# Patient Record
Sex: Female | Born: 1965 | Race: White | Hispanic: No | Smoking: Never smoker
Health system: Southern US, Community
[De-identification: ages and names within clinical notes are randomized; demographics above are authoritative.]

## PROBLEM LIST (undated history)

## (undated) DIAGNOSIS — T8859XA Other complications of anesthesia, initial encounter: Secondary | ICD-10-CM

## (undated) DIAGNOSIS — Z9989 Dependence on other enabling machines and devices: Secondary | ICD-10-CM

## (undated) DIAGNOSIS — Z8619 Personal history of other infectious and parasitic diseases: Secondary | ICD-10-CM

## (undated) DIAGNOSIS — Z9889 Other specified postprocedural states: Secondary | ICD-10-CM

## (undated) DIAGNOSIS — M545 Low back pain, unspecified: Secondary | ICD-10-CM

## (undated) DIAGNOSIS — R519 Headache, unspecified: Secondary | ICD-10-CM

## (undated) DIAGNOSIS — R51 Headache: Secondary | ICD-10-CM

## (undated) DIAGNOSIS — Z9884 Bariatric surgery status: Secondary | ICD-10-CM

## (undated) DIAGNOSIS — D259 Leiomyoma of uterus, unspecified: Secondary | ICD-10-CM

## (undated) DIAGNOSIS — R112 Nausea with vomiting, unspecified: Secondary | ICD-10-CM

## (undated) DIAGNOSIS — G8929 Other chronic pain: Secondary | ICD-10-CM

## (undated) DIAGNOSIS — T4145XA Adverse effect of unspecified anesthetic, initial encounter: Secondary | ICD-10-CM

## (undated) DIAGNOSIS — I10 Essential (primary) hypertension: Secondary | ICD-10-CM

## (undated) DIAGNOSIS — Z973 Presence of spectacles and contact lenses: Secondary | ICD-10-CM

## (undated) DIAGNOSIS — K760 Fatty (change of) liver, not elsewhere classified: Secondary | ICD-10-CM

## (undated) DIAGNOSIS — M1990 Unspecified osteoarthritis, unspecified site: Secondary | ICD-10-CM

## (undated) DIAGNOSIS — Z87440 Personal history of urinary (tract) infections: Secondary | ICD-10-CM

## (undated) DIAGNOSIS — G4733 Obstructive sleep apnea (adult) (pediatric): Secondary | ICD-10-CM

## (undated) HISTORY — PX: OTHER SURGICAL HISTORY: SHX169

## (undated) HISTORY — PX: TONSILLECTOMY: SUR1361

## (undated) HISTORY — PX: TYMPANOSTOMY TUBE PLACEMENT: SHX32

## (undated) HISTORY — PX: ADENOIDECTOMY: SUR15

## (undated) HISTORY — PX: LAPAROSCOPIC CHOLECYSTECTOMY: SUR755

## (undated) HISTORY — PX: APPENDECTOMY: SHX54

---

## 1997-12-23 ENCOUNTER — Encounter: Admission: RE | Admit: 1997-12-23 | Discharge: 1998-03-23 | Payer: Self-pay | Admitting: Obstetrics and Gynecology

## 1999-02-10 ENCOUNTER — Other Ambulatory Visit: Admission: RE | Admit: 1999-02-10 | Discharge: 1999-02-10 | Payer: Self-pay | Admitting: Obstetrics and Gynecology

## 1999-10-18 ENCOUNTER — Emergency Department (HOSPITAL_COMMUNITY): Admission: EM | Admit: 1999-10-18 | Discharge: 1999-10-18 | Payer: Self-pay | Admitting: Emergency Medicine

## 2000-07-04 ENCOUNTER — Other Ambulatory Visit: Admission: RE | Admit: 2000-07-04 | Discharge: 2000-07-04 | Payer: Self-pay | Admitting: Obstetrics and Gynecology

## 2001-06-27 ENCOUNTER — Other Ambulatory Visit: Admission: RE | Admit: 2001-06-27 | Discharge: 2001-06-27 | Payer: Self-pay | Admitting: Obstetrics and Gynecology

## 2002-09-05 ENCOUNTER — Emergency Department (HOSPITAL_COMMUNITY): Admission: EM | Admit: 2002-09-05 | Discharge: 2002-09-05 | Payer: Self-pay | Admitting: Emergency Medicine

## 2002-10-04 ENCOUNTER — Emergency Department (HOSPITAL_COMMUNITY): Admission: EM | Admit: 2002-10-04 | Discharge: 2002-10-04 | Payer: Self-pay | Admitting: Emergency Medicine

## 2003-02-05 ENCOUNTER — Other Ambulatory Visit: Admission: RE | Admit: 2003-02-05 | Discharge: 2003-02-05 | Payer: Self-pay | Admitting: Obstetrics and Gynecology

## 2003-02-19 ENCOUNTER — Ambulatory Visit (HOSPITAL_COMMUNITY): Admission: RE | Admit: 2003-02-19 | Discharge: 2003-02-19 | Payer: Self-pay | Admitting: Family Medicine

## 2003-07-10 ENCOUNTER — Encounter: Admission: RE | Admit: 2003-07-10 | Discharge: 2003-07-10 | Payer: Self-pay | Admitting: Family Medicine

## 2003-07-10 ENCOUNTER — Encounter: Payer: Self-pay | Admitting: Family Medicine

## 2003-08-03 ENCOUNTER — Encounter: Admission: RE | Admit: 2003-08-03 | Discharge: 2003-08-03 | Payer: Self-pay | Admitting: Family Medicine

## 2003-09-02 ENCOUNTER — Encounter: Admission: RE | Admit: 2003-09-02 | Discharge: 2003-11-13 | Payer: Self-pay | Admitting: Neurosurgery

## 2003-10-04 ENCOUNTER — Emergency Department (HOSPITAL_COMMUNITY): Admission: EM | Admit: 2003-10-04 | Discharge: 2003-10-04 | Payer: Self-pay | Admitting: Emergency Medicine

## 2003-10-11 ENCOUNTER — Encounter: Admission: RE | Admit: 2003-10-11 | Discharge: 2003-10-11 | Payer: Self-pay | Admitting: Neurosurgery

## 2003-10-25 ENCOUNTER — Encounter: Admission: RE | Admit: 2003-10-25 | Discharge: 2003-10-25 | Payer: Self-pay | Admitting: Neurosurgery

## 2003-12-02 ENCOUNTER — Ambulatory Visit (HOSPITAL_COMMUNITY): Admission: RE | Admit: 2003-12-02 | Discharge: 2003-12-03 | Payer: Self-pay | Admitting: Neurosurgery

## 2003-12-02 HISTORY — PX: LAMINECTOMY AND MICRODISCECTOMY LUMBAR SPINE: SHX1913

## 2003-12-30 ENCOUNTER — Encounter: Admission: RE | Admit: 2003-12-30 | Discharge: 2003-12-30 | Payer: Self-pay | Admitting: Neurosurgery

## 2004-01-14 ENCOUNTER — Encounter: Admission: RE | Admit: 2004-01-14 | Discharge: 2004-03-05 | Payer: Self-pay | Admitting: Neurosurgery

## 2004-11-17 ENCOUNTER — Other Ambulatory Visit: Admission: RE | Admit: 2004-11-17 | Discharge: 2004-11-17 | Payer: Self-pay | Admitting: Obstetrics and Gynecology

## 2008-12-23 ENCOUNTER — Encounter: Admission: RE | Admit: 2008-12-23 | Discharge: 2008-12-23 | Payer: Self-pay | Admitting: Obstetrics and Gynecology

## 2009-11-05 ENCOUNTER — Encounter: Admission: RE | Admit: 2009-11-05 | Discharge: 2009-11-05 | Payer: Self-pay | Admitting: Neurosurgery

## 2010-04-21 ENCOUNTER — Ambulatory Visit (HOSPITAL_BASED_OUTPATIENT_CLINIC_OR_DEPARTMENT_OTHER): Admission: RE | Admit: 2010-04-21 | Discharge: 2010-04-21 | Payer: Self-pay | Admitting: Obstetrics and Gynecology

## 2010-04-21 ENCOUNTER — Ambulatory Visit: Payer: Self-pay | Admitting: Diagnostic Radiology

## 2010-05-10 ENCOUNTER — Emergency Department (HOSPITAL_COMMUNITY): Admission: EM | Admit: 2010-05-10 | Discharge: 2010-05-10 | Payer: Self-pay | Admitting: Emergency Medicine

## 2010-10-09 ENCOUNTER — Emergency Department (HOSPITAL_COMMUNITY)
Admission: EM | Admit: 2010-10-09 | Discharge: 2010-10-09 | Payer: Self-pay | Source: Home / Self Care | Admitting: Emergency Medicine

## 2010-10-10 ENCOUNTER — Encounter: Payer: Self-pay | Admitting: Neurosurgery

## 2010-10-11 ENCOUNTER — Encounter: Payer: Self-pay | Admitting: Family Medicine

## 2010-10-12 LAB — CBC
Hemoglobin: 14 g/dL (ref 12.0–15.0)
MCH: 29.6 pg (ref 26.0–34.0)
MCV: 88.2 fL (ref 78.0–100.0)
RBC: 4.73 MIL/uL (ref 3.87–5.11)

## 2010-10-12 LAB — BASIC METABOLIC PANEL
BUN: 8 mg/dL (ref 6–23)
CO2: 28 mEq/L (ref 19–32)
Chloride: 103 mEq/L (ref 96–112)
Creatinine, Ser: 0.71 mg/dL (ref 0.4–1.2)

## 2010-10-12 LAB — POCT PREGNANCY, URINE: Preg Test, Ur: NEGATIVE

## 2010-10-12 LAB — DIFFERENTIAL
Eosinophils Absolute: 0.2 10*3/uL (ref 0.0–0.7)
Lymphs Abs: 2.7 10*3/uL (ref 0.7–4.0)
Monocytes Absolute: 0.7 10*3/uL (ref 0.1–1.0)
Monocytes Relative: 7 % (ref 3–12)
Neutro Abs: 7.2 10*3/uL (ref 1.7–7.7)
Neutrophils Relative %: 66 % (ref 43–77)

## 2010-10-12 LAB — URINALYSIS, ROUTINE W REFLEX MICROSCOPIC
Nitrite: NEGATIVE
Specific Gravity, Urine: 1.011 (ref 1.005–1.030)
pH: 5.5 (ref 5.0–8.0)

## 2010-10-12 LAB — URINE MICROSCOPIC-ADD ON

## 2010-10-12 LAB — CK TOTAL AND CKMB (NOT AT ARMC): Total CK: 29 U/L (ref 7–177)

## 2011-02-05 NOTE — Op Note (Signed)
NAME:  Kathryn Washington, Kathryn Washington                         ACCOUNT NO.:  [id number]   MEDICAL RECORD NO.:  [id number]                   PATIENT TYPE:  OIB   LOCATION:  6734                                 FACILITY:  [facility]   PHYSICIAN:  Donalee Citrin, M.D.                     DATE OF BIRTH:  October 21, 1965   DATE OF PROCEDURE:  12/02/2003  DATE OF DISCHARGE:                                 OPERATIVE REPORT   PREOPERATIVE DIAGNOSIS:  Left S1 radiculopathy from lumbar spondylosis with  lateral recess stenosis and ruptured disk, L5-S1 left.   PROCEDURE:  Lumbar laminectomy and microdiskectomy, L5-S1 left, with  microscopic dissection of the left S1 nerve root.   SURGEON:  Donalee Citrin, M.D.   ASSISTANT:  Kathaleen Maser. Pool, M.D.   ANESTHESIA:  General endotracheal.   HISTORY OF PRESENT ILLNESS:  The patient is a very pleasant 45 year old  female who has had longstanding back and left leg pain refractory to all  forms of conservative treatment with anti-inflammatories, epidural steroid  injections, and therapy.  The patient's preoperative imaging showed lumbar  spondylosis with disk deterioration, disk bulge, annular tear, and a small  piece of ruptured disk nestled under the S1 nerve root on the left side.  The patient was recommended laminectomy and microdiskectomy.  I extensively  went over the risks and benefits of surgery with her.  She understands and  agreed to proceed forward.   The patient was brought into the OR and was induced under general  anesthesia, was positioned prone on the Wilson frame.  The back was prepped  and draped in the usual sterile fashion.  Preoperative x-ray localized the  L5-S1 disk space.  After infiltration of 10 mL of lidocaine with  epinephrine, a midline incision was made just above the top of a tattoo she  had on her lumbar spine.  Bovie electrocautery was used to take down the  subcutaneous tissues, and subperiosteal dissection was carried out in the  lamina of L5 and S1  on the left side as well as L4-5.  Intraoperative x-ray  confirmed localization of the L4-5 disk space.  Attention was taken one  level below this.  The self-retaining retractor was repositioned and then  the inferior aspect of the lamina of L5 was removed with a 3 mm Kerrison  punch above the medial aspect of the facet complex, exposing the ligamentum  flavum, which was removed in piecemeal fashion, exposing the thecal sac.  Then the proximal aspect of the S1 nerve root was identified and the  operating microscope was draped and brought into the field.  Under  microscopic illumination the S1 nerve root was identified and radically  decompressed out its neural foramen.  Then using a 4 Penfield, the S1 nerve  root was dissected off a bulbous disk fragment that was nestled behind the  superior end plate of S1 as well  as the disk was noted to be markedly  bulging and compressing the proximal aspect of the S1 nerve root.  Annulotomy was done with an 11 blade scalpel and pituitary rongeurs were  used to radically open the disk space.  Using a combination of pituitary  rongeurs, down-going Epstein curettes, the disk space was cleaned out, both  the lateral and medial compartments as well as the inferior compartment  where that fragment had nestled under the S1 nerve root.  All this was  removed.  The disk space was cleaned out.  The lateral recess was  decompressed and the S1 neural foramen was widely patent.  At the end of the  decompression the thecal sac and S1 nerve root were explored with a coronary  dilator and angled hockey stick and noted to have no further stenosis.  Gelfoam was overlaid on top of the dura.  The muscle and fascia were  reapproximated with 0 interrupted Vicryl after copious irrigation.  Subcutaneous tissue was closed with 2-0  interrupted Vicryl, and the skin was closed using a running 4-0  subcuticular.  Benzoin and Steri-Strips were applied.  The patient went to  the  recovery room in stable condition.  At the end of the case the needle  counts and sponge counts were correct.                                               Donalee Citrin, M.D.    GC/MEDQ  D:  12/02/2003  T:  12/03/2003  Job:  [id number]
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
## 2012-01-11 ENCOUNTER — Emergency Department (HOSPITAL_BASED_OUTPATIENT_CLINIC_OR_DEPARTMENT_OTHER)
Admission: EM | Admit: 2012-01-11 | Discharge: 2012-01-11 | Disposition: A | Discharge status: Home / Self Care | Payer: BC Managed Care – PPO | Attending: Emergency Medicine | Admitting: Emergency Medicine

## 2012-01-11 ENCOUNTER — Emergency Department (INDEPENDENT_AMBULATORY_CARE_PROVIDER_SITE_OTHER): Payer: BC Managed Care – PPO

## 2012-01-11 ENCOUNTER — Encounter (HOSPITAL_BASED_OUTPATIENT_CLINIC_OR_DEPARTMENT_OTHER): Payer: Self-pay | Admitting: Student

## 2012-01-11 DIAGNOSIS — R0602 Shortness of breath: Secondary | ICD-10-CM | POA: Insufficient documentation

## 2012-01-11 DIAGNOSIS — R112 Nausea with vomiting, unspecified: Secondary | ICD-10-CM | POA: Insufficient documentation

## 2012-01-11 DIAGNOSIS — R42 Dizziness and giddiness: Secondary | ICD-10-CM | POA: Insufficient documentation

## 2012-01-11 DIAGNOSIS — I10 Essential (primary) hypertension: Secondary | ICD-10-CM | POA: Insufficient documentation

## 2012-01-11 DIAGNOSIS — R197 Diarrhea, unspecified: Secondary | ICD-10-CM | POA: Insufficient documentation

## 2012-01-11 DIAGNOSIS — R61 Generalized hyperhidrosis: Secondary | ICD-10-CM | POA: Insufficient documentation

## 2012-01-11 HISTORY — DX: Essential (primary) hypertension: I10

## 2012-01-11 LAB — COMPREHENSIVE METABOLIC PANEL
ALT: 21 U/L (ref 0–35)
Albumin: 4.4 g/dL (ref 3.5–5.2)
Calcium: 10.3 mg/dL (ref 8.4–10.5)
GFR calc Af Amer: 69 mL/min — ABNORMAL LOW (ref >90)
Glucose, Bld: 132 mg/dL — ABNORMAL HIGH (ref 70–99)
Potassium: 3.5 mEq/L (ref 3.5–5.1)
Sodium: 136 mEq/L (ref 135–145)
Total Protein: 7.5 g/dL (ref 6.0–8.3)

## 2012-01-11 LAB — DIFFERENTIAL
Basophils Relative: 0 % (ref 0–1)
Eosinophils Absolute: 0.1 10*3/uL (ref 0.0–0.7)
Lymphocytes Relative: 22 % (ref 12–46)
Lymphs Abs: 3.3 10*3/uL (ref 0.7–4.0)
Monocytes Absolute: 1.3 10*3/uL — ABNORMAL HIGH (ref 0.1–1.0)
Neutrophils Relative %: 68 % (ref 43–77)

## 2012-01-11 LAB — CBC
MCH: 30.1 pg (ref 26.0–34.0)
MCHC: 35.1 g/dL (ref 30.0–36.0)
Platelets: 424 10*3/uL — ABNORMAL HIGH (ref 150–400)
RDW: 13.3 % (ref 11.5–15.5)

## 2012-01-11 LAB — CARDIAC PANEL(CRET KIN+CKTOT+MB+TROPI)
CK, MB: 1.2 ng/mL (ref 0.3–4.0)
CK, MB: 1.6 ng/mL (ref 0.3–4.0)
Relative Index: INVALID (ref 0.0–2.5)
Troponin I: <0.3 ng/mL (ref <0.30)

## 2012-01-11 LAB — LIPASE, BLOOD: Lipase: 37 U/L (ref 11–59)

## 2012-01-11 MED ORDER — ONDANSETRON HCL 4 MG/2ML IJ SOLN
4.0000 mg | Freq: Once | INTRAMUSCULAR | Status: AC
  Administered 2012-01-11: 4 mg via INTRAVENOUS

## 2012-01-11 MED ORDER — SODIUM CHLORIDE 0.9 % IV BOLUS (SEPSIS)
1000.0000 mL | Freq: Once | INTRAVENOUS | Status: AC
Start: 2012-01-11 — End: 2012-01-11
  Administered 2012-01-11: 1000 mL via INTRAVENOUS

## 2012-01-11 MED ORDER — ONDANSETRON HCL 4 MG/2ML IJ SOLN
INTRAMUSCULAR | Status: AC
  Administered 2012-01-11: 4 mg via INTRAVENOUS
  Filled 2012-01-11: qty 2

## 2012-01-11 MED ORDER — ONDANSETRON HCL 8 MG PO TABS: 8.0000 mg | ORAL_TABLET | Freq: Three times a day (TID) | ORAL | Status: AC | PRN

## 2012-01-11 MED ORDER — ACETAMINOPHEN 325 MG PO TABS
650.0000 mg | ORAL_TABLET | Freq: Once | ORAL | Status: AC
  Administered 2012-01-11: 650 mg via ORAL
  Filled 2012-01-11: qty 2

## 2012-01-11 MED ORDER — ONDANSETRON HCL 4 MG/2ML IJ SOLN
INTRAMUSCULAR | Status: AC
  Filled 2012-01-11: qty 2

## 2012-01-11 MED ORDER — SODIUM CHLORIDE 0.9 % IV BOLUS (SEPSIS)
1000.0000 mL | Freq: Once | INTRAVENOUS | Status: AC
  Administered 2012-01-11: 1000 mL via INTRAVENOUS

## 2012-01-11 NOTE — ED Provider Notes (Signed)
History     CSN: [id number]  Arrival date & time 01/11/12  1000   First MD Initiated Contact with Patient 01/11/12 1006      Chief Complaint  Patient presents with  . Nausea  . Emesis  . Dizziness  . Hypertension    149/113 at scene    (Consider location/radiation/quality/duration/timing/severity/associated sxs/prior treatment) HPI Comments: Patient states she's been lightheaded, diaphoretic and nauseated since about 7 AM. He continued to worsen until she started vomiting about 30 minutes ago. She is still having intermittent presyncopal symptoms with nausea. She denies any chest pain but states she is just unable to breathe well  Patient is a 46 y.o. female presenting with vomiting. The history is provided by the patient and the EMS personnel.  Emesis  This is a new problem. The current episode started 1 to 2 hours ago. Episode frequency: 1 time. The problem has been gradually worsening. The emesis has an appearance of stomach contents. There has been no fever. Associated symptoms include sweats. Pertinent negatives include no chills, no cough, no diarrhea and no URI. Associated symptoms comments: Abdominal cramps but no diarrhea. Risk factors: none.    History reviewed. No pertinent past medical history.  History reviewed. No pertinent past surgical history.  History reviewed. No pertinent family history.  History  Substance Use Topics  . Smoking status: Never Smoker   . Smokeless tobacco: Not on file  . Alcohol Use: No    OB History    Grav Para Term Preterm Abortions TAB SAB Ect Mult Living                  Review of Systems  Constitutional: Negative for chills.  Respiratory: Positive for shortness of breath. Negative for cough.   Cardiovascular: Negative for chest pain and leg swelling.  Gastrointestinal: Positive for nausea and vomiting. Negative for diarrhea.       Mild abdominal cramps  All other systems reviewed and are negative.    Allergies  Review  of patient's allergies indicates no known allergies.  Home Medications  No current outpatient prescriptions on file.  LMP 01/11/2012  Physical Exam  Nursing note and vitals reviewed. Constitutional: She is oriented to person, place, and time. She appears well-developed and well-nourished. She appears distressed.       Hypotensive  HENT:  Head: Normocephalic and atraumatic.  Eyes: EOM are normal. Pupils are equal, round, and reactive to light.  Cardiovascular: Normal rate, regular rhythm, normal heart sounds and intact distal pulses.  Exam reveals no friction rub.   No murmur heard. Pulmonary/Chest: Effort normal and breath sounds normal. She has no wheezes. She has no rales.  Abdominal: Soft. Bowel sounds are normal. She exhibits no distension. There is no tenderness. There is no rebound and no guarding.  Musculoskeletal: Normal range of motion. She exhibits no tenderness.       No edema  Neurological: She is alert and oriented to person, place, and time. No cranial nerve deficit.  Skin: Skin is warm. No rash noted. She is diaphoretic. There is pallor.  Psychiatric: She has a normal mood and affect. Her behavior is normal.    ED Course  Procedures (including critical care time)  Labs Reviewed  CBC - Abnormal; Notable for the following:    WBC 14.9 (*)    RBC 5.28 (*)    Hemoglobin 15.9 (*)    Platelets 424 (*)    All other components within normal limits  DIFFERENTIAL - Abnormal; Notable  for the following:    Neutro Abs 10.2 (*)    Monocytes Absolute 1.3 (*)    All other components within normal limits  COMPREHENSIVE METABOLIC PANEL - Abnormal; Notable for the following:    Chloride 95 (*)    Glucose, Bld 132 (*)    GFR calc non Af Amer 60 (*)    GFR calc Af Amer 69 (*)    All other components within normal limits  LIPASE, BLOOD  CARDIAC PANEL(CRET KIN+CKTOT+MB+TROPI)  URINALYSIS, ROUTINE W REFLEX MICROSCOPIC  PREGNANCY, URINE   Dg Chest Port 1 View  01/11/2012   *RADIOLOGY REPORT*  Clinical Data: Shortness of breath.  PORTABLE CHEST - 1 VIEW  Comparison: None  Findings: The heart is upper limits of normal in size given the AP projection.  The mediastinal and hilar contours are within normal limits.  The lungs are clear.  No large pleural effusion or pneumothorax.  The bony thorax is intact.  IMPRESSION: No acute cardiopulmonary findings.  Original Report Authenticated By: P. Loralie Champagne, M.D.     Date: 01/11/2012  Rate: 68  Rhythm: normal sinus rhythm  QRS Axis: normal  Intervals: normal  ST/T Wave abnormalities: normal  Conduction Disutrbances: none  Narrative Interpretation: unremarkable      No diagnosis found.    MDM   Patient with diaphoresis, nausea, lightheaded and vomiting today. She is also complaining of stomach cramping similar to the symptoms he would feel if he were to have diarrhea. However she has not had any diarrhea at this point.  Symptoms have been going on since 7 AM today. She has a history of hypertension but no other cardiac risk factors. She has taken her antihypertensive this morning her blood pressure on the scene was 150/100.  However he patient is diaphoretic, pale and states she feels like she is going to throw up. Her blood pressure here is 70/50 and she appears to be having a vagal response with HR in the 60's.  She denies any chest pain but states she feels that she's not breathing well. She has no abdominal tenderness and denies any coughing or upper respiratory complaints. Patient given Zofran and IV fluids. EKG was within normal limits. Concern for possible cardiac etiology and CBC, CMP, cardiac enzymes, chest x-ray pending. Secondly possible viral etiology causing nausea and vomiting. Low risk for PE and PERC negative. Status post cholecystectomy and appendectomy although concern for abdominal pathology at this time. Patient's symptoms are not suggestive of dissection the patient has normal pulses  peripherally.  Patient given IV fluids and will watch closely.  11:30 AM Cardiac enzymes, CMP, lipase, chest x-ray are all within normal limits. CBC with leukocytosis of 14,000. After 1 L of IV fluids and Zofran patient strained felt better repeat blood pressure 100/60. Will give a second liter and repeat cardiac enzymes in 3 hours.    1:08 PM On repeat evaluation patient is feeling better. She has had one episode of diarrhea in the bathroom states her stomach cramps are improved. Will po challenge  2:01 PM Patient feeling much better. Tolerating soda and crackers. Repeat blood pressure 12 6/67  Gwyneth Sprout, MD 01/11/12 1401

## 2012-01-11 NOTE — Discharge Instructions (Signed)
B.R.A.T. Diet Your doctor has recommended the B.R.A.T. diet for you or your child until the condition improves. This is often used to help control diarrhea and vomiting symptoms. If you or your child can tolerate clear liquids, you may have:  Bananas.   Rice.   Applesauce.   Toast (and other simple starches such as crackers, potatoes, noodles).  Be sure to avoid dairy products, meats, and fatty foods until symptoms are better. Fruit juices such as apple, grape, and prune juice can make diarrhea worse. Avoid these. Continue this diet for 2 days or as instructed by your caregiver. Document Released: 09/06/2005 Document Revised: 08/26/2011 Document Reviewed: 02/23/2007 Exit[employer] Patient Information 2012 [employer]. 

## 2012-01-11 NOTE — ED Notes (Signed)
MD at bedside. 

## 2012-01-11 NOTE — ED Notes (Signed)
Pt in via EMS from work with c/o sudden onset of N V diaphoresis and hypertension, EMS 20 G left hand and 4mg  zofran enroute.

## 2012-01-11 NOTE — ED Notes (Signed)
Pt c/o dizziness upon sitting up, reassessed falls - pt is listed as falls risk, yellow arm band applied. Family present.

## 2012-11-10 ENCOUNTER — Ambulatory Visit: Payer: Self-pay | Admitting: Specialist

## 2012-11-10 DIAGNOSIS — Z01810 Encounter for preprocedural cardiovascular examination: Secondary | ICD-10-CM

## 2012-11-10 LAB — TSH: Thyroid Stimulating Horm: 2.91 u[IU]/mL

## 2012-11-10 LAB — CBC WITH DIFFERENTIAL/PLATELET
Basophil #: 0.1 10*3/uL (ref 0.0–0.1)
Eosinophil #: 0.2 10*3/uL (ref 0.0–0.7)
Eosinophil %: 2.4 %
HGB: 14.3 g/dL (ref 12.0–16.0)
Lymphocyte #: 2.7 10*3/uL (ref 1.0–3.6)
MCH: 29.4 pg (ref 26.0–34.0)
MCHC: 33.7 g/dL (ref 32.0–36.0)
MCV: 87 fL (ref 80–100)
Monocyte #: 0.6 x10 3/mm (ref 0.2–0.9)
Monocyte %: 6.7 %
Neutrophil #: 5 10*3/uL (ref 1.4–6.5)
Platelet: 321 10*3/uL (ref 150–440)
RDW: 14.3 % (ref 11.5–14.5)

## 2012-11-10 LAB — HEMOGLOBIN A1C: Hemoglobin A1C: 5.7 % (ref 4.2–6.3)

## 2012-11-10 LAB — COMPREHENSIVE METABOLIC PANEL
Albumin: 3.5 g/dL (ref 3.4–5.0)
Anion Gap: 8 (ref 7–16)
BUN: 13 mg/dL (ref 7–18)
Bilirubin,Total: 0.4 mg/dL (ref 0.2–1.0)
Calcium, Total: 9.2 mg/dL (ref 8.5–10.1)
Chloride: 104 mmol/L (ref 98–107)
Creatinine: 0.76 mg/dL (ref 0.60–1.30)
Glucose: 99 mg/dL (ref 65–99)
Potassium: 3.7 mmol/L (ref 3.5–5.1)
SGOT(AST): 17 U/L (ref 15–37)
Sodium: 139 mmol/L (ref 136–145)

## 2012-11-10 LAB — HCG, QUANTITATIVE, PREGNANCY: Beta Hcg, Quant.: <1 m[IU]/mL — ABNORMAL LOW

## 2012-11-10 LAB — AMYLASE: Amylase: 20 U/L — ABNORMAL LOW (ref 25–115)

## 2012-11-10 LAB — IRON AND TIBC: Unbound Iron-Bind.Cap.: 260 ug/dL

## 2012-11-10 LAB — FOLATE: Folic Acid: 10.6 ng/mL (ref 3.1–100.0)

## 2012-11-10 LAB — PHOSPHORUS: Phosphorus: 3.1 mg/dL (ref 2.5–4.9)

## 2012-11-10 LAB — MAGNESIUM: Magnesium: 2 mg/dL

## 2012-11-10 LAB — BILIRUBIN, DIRECT: Bilirubin, Direct: 0.1 mg/dL (ref 0.00–0.20)

## 2012-11-10 LAB — FERRITIN: Ferritin (ARMC): 48 ng/mL (ref 8–388)

## 2012-11-13 LAB — LIPASE, BLOOD: Lipase: 121 U/L (ref 73–393)

## 2012-12-11 ENCOUNTER — Ambulatory Visit: Payer: Self-pay | Admitting: Specialist

## 2012-12-19 ENCOUNTER — Ambulatory Visit: Payer: Self-pay | Admitting: Specialist

## 2013-12-04 ENCOUNTER — Other Ambulatory Visit: Payer: Self-pay | Admitting: Internal Medicine

## 2013-12-04 ENCOUNTER — Ambulatory Visit
Admission: RE | Admit: 2013-12-04 | Discharge: 2013-12-04 | Disposition: A | Discharge status: Home / Self Care | Payer: BC Managed Care – PPO | Source: Ambulatory Visit | Attending: Internal Medicine | Admitting: Internal Medicine

## 2013-12-04 DIAGNOSIS — R945 Abnormal results of liver function studies: Principal | ICD-10-CM

## 2013-12-04 DIAGNOSIS — R7989 Other specified abnormal findings of blood chemistry: Secondary | ICD-10-CM

## 2013-12-04 DIAGNOSIS — R748 Abnormal levels of other serum enzymes: Secondary | ICD-10-CM

## 2013-12-04 DIAGNOSIS — R894 Abnormal immunological findings in specimens from other organs, systems and tissues: Secondary | ICD-10-CM

## 2014-01-02 ENCOUNTER — Ambulatory Visit: Payer: Self-pay | Admitting: Obstetrics

## 2014-08-21 ENCOUNTER — Other Ambulatory Visit (HOSPITAL_COMMUNITY): Payer: Self-pay | Admitting: Nurse Practitioner

## 2014-08-21 DIAGNOSIS — B182 Chronic viral hepatitis C: Secondary | ICD-10-CM

## 2014-09-03 ENCOUNTER — Ambulatory Visit (HOSPITAL_COMMUNITY)
Admission: RE | Admit: 2014-09-03 | Discharge: 2014-09-03 | Disposition: A | Discharge status: Home / Self Care | Payer: BC Managed Care – PPO | Source: Ambulatory Visit | Attending: Nurse Practitioner | Admitting: Nurse Practitioner

## 2014-09-03 DIAGNOSIS — B182 Chronic viral hepatitis C: Secondary | ICD-10-CM | POA: Diagnosis present

## 2014-10-30 ENCOUNTER — Other Ambulatory Visit (INDEPENDENT_AMBULATORY_CARE_PROVIDER_SITE_OTHER): Payer: Self-pay | Admitting: General Surgery

## 2014-10-30 DIAGNOSIS — K219 Gastro-esophageal reflux disease without esophagitis: Secondary | ICD-10-CM

## 2014-10-30 DIAGNOSIS — I10 Essential (primary) hypertension: Secondary | ICD-10-CM

## 2014-10-30 DIAGNOSIS — R05 Cough: Secondary | ICD-10-CM

## 2014-10-30 DIAGNOSIS — R053 Chronic cough: Secondary | ICD-10-CM

## 2014-11-12 ENCOUNTER — Other Ambulatory Visit (INDEPENDENT_AMBULATORY_CARE_PROVIDER_SITE_OTHER): Payer: Self-pay | Admitting: General Surgery

## 2014-11-12 NOTE — Addendum Note (Signed)
Addended by: Redmond Pulling, Rasheeda Mulvehill M on: 11/12/2014 01:06 PM   Modules accepted: Orders

## 2014-12-03 ENCOUNTER — Ambulatory Visit (HOSPITAL_COMMUNITY): Payer: Self-pay

## 2014-12-03 ENCOUNTER — Ambulatory Visit (HOSPITAL_COMMUNITY)
Admission: RE | Admit: 2014-12-03 | Discharge: 2014-12-03 | Disposition: A | Discharge status: Home / Self Care | Payer: BLUE CROSS/BLUE SHIELD | Source: Ambulatory Visit | Attending: General Surgery | Admitting: General Surgery

## 2014-12-03 ENCOUNTER — Encounter: Payer: BLUE CROSS/BLUE SHIELD | Attending: General Surgery | Admitting: Dietician

## 2014-12-03 ENCOUNTER — Encounter: Payer: Self-pay | Admitting: Dietician

## 2014-12-03 DIAGNOSIS — I10 Essential (primary) hypertension: Secondary | ICD-10-CM | POA: Diagnosis not present

## 2014-12-03 DIAGNOSIS — M549 Dorsalgia, unspecified: Secondary | ICD-10-CM | POA: Diagnosis not present

## 2014-12-03 DIAGNOSIS — R05 Cough: Secondary | ICD-10-CM

## 2014-12-03 DIAGNOSIS — R053 Chronic cough: Secondary | ICD-10-CM

## 2014-12-03 DIAGNOSIS — Z6841 Body Mass Index (BMI) 40.0 and over, adult: Secondary | ICD-10-CM | POA: Insufficient documentation

## 2014-12-03 DIAGNOSIS — Z713 Dietary counseling and surveillance: Secondary | ICD-10-CM | POA: Insufficient documentation

## 2014-12-03 DIAGNOSIS — M1990 Unspecified osteoarthritis, unspecified site: Secondary | ICD-10-CM | POA: Diagnosis not present

## 2014-12-03 DIAGNOSIS — G4730 Sleep apnea, unspecified: Secondary | ICD-10-CM | POA: Insufficient documentation

## 2014-12-03 DIAGNOSIS — K219 Gastro-esophageal reflux disease without esophagitis: Secondary | ICD-10-CM | POA: Diagnosis not present

## 2014-12-03 DIAGNOSIS — G8929 Other chronic pain: Secondary | ICD-10-CM | POA: Diagnosis not present

## 2014-12-03 NOTE — Patient Instructions (Signed)

## 2014-12-03 NOTE — Progress Notes (Signed)
  Pre-Op Assessment Visit:  Pre-Operative RYGB Surgery  Medical Nutrition Therapy:  Appt start time: 340   End time:  902  Patient was seen on 12/03/2014 for Pre-Operative Nutrition Assessment. Assessment and letter of approval faxed to [facility] Bariatric Surgery Program coordinator on 12/03/2014.   Preferred Learning Style:   No preference indicated   Learning Readiness:   Ready  Handouts given during visit include:  Pre-Op Goals Bariatric Surgery Protein Shakes   During the appointment today the following Pre-Op Goals were reviewed with the patient: Maintain or lose weight as instructed by your surgeon Make healthy food choices Begin to limit portion sizes Limited concentrated sugars and fried foods Keep fat/sugar in the single digits per serving on   food labels Practice CHEWING your food  (aim for 30 chews per bite or until applesauce consistency) Practice not drinking 15 minutes before, during, and 30 minutes after each meal/snack Avoid all carbonated beverages  Avoid/limit caffeinated beverages  Avoid all sugar-sweetened beverages Consume 3 meals per day; eat every 3-5 hours Make a list of non-food related activities Aim for 64-100 ounces of FLUID daily  Aim for at least 60-80 grams of PROTEIN daily Look for a liquid protein source that contain ?15 g protein and ?5 g carbohydrate  (ex: shakes, drinks, shots)  Patient-Centered Goals: -Overall health -Relieved back pain -Increased mobility  Scale of 1-10: confidence (9 or 10) / importance (10)  Demonstrated degree of understanding via:  Teach Back  Teaching Method Utilized:  Visual Auditory Hands on  Barriers to learning/adherence to lifestyle change: back pain  [facility] to [facility] the [facility] to enroll in Pre-Op and Post-Op Nutrition Education when surgery date is scheduled.

## 2014-12-30 ENCOUNTER — Institutional Professional Consult (permissible substitution): Payer: Self-pay | Admitting: Pulmonary Disease

## 2014-12-31 ENCOUNTER — Ambulatory Visit: Payer: BLUE CROSS/BLUE SHIELD | Admitting: Dietician

## 2015-01-02 ENCOUNTER — Encounter: Payer: BLUE CROSS/BLUE SHIELD | Attending: General Surgery | Admitting: Dietician

## 2015-01-02 DIAGNOSIS — Z713 Dietary counseling and surveillance: Secondary | ICD-10-CM | POA: Diagnosis not present

## 2015-01-02 DIAGNOSIS — Z6841 Body Mass Index (BMI) 40.0 and over, adult: Secondary | ICD-10-CM | POA: Insufficient documentation

## 2015-01-02 NOTE — Patient Instructions (Addendum)
Look into starting water aerobics. Work on cutting back on soda. Continue drinking more water. Keep a water bottle at work. Start working on chewing 20-30 x per bite. Try eat meals and snacks with no distractions.  For smoothies - protein powder, 1 cup or less total fruit, vegetables, nuts/PB2 ice/water/unsweetened almond milk

## 2015-01-02 NOTE — Progress Notes (Signed)
  12 Months Supervised Weight Loss Visit:   Pre-Operative RYGB Surgery  Medical Nutrition Therapy:  Appt start time: 3762 end time:  8315.  Primary concerns today: Supervised Weight Loss Visit #1. Returns with a 2 lbs weight loss. Has been working on drinking more water. Bought some Community education officer to try. Having a lot of stress. Might skip meals if too busy at work. Went on vacation in the past month and got sick too. Has a roommate that is not supportive of her getting surgery. Planning to move in with her mother soon.   Has back pain and arthritis so she can't do a lot of exercise. Used to drink a 2 liter of soda each day.    Weight: 380.2 lbs BMI: 57.8  Preferred Learning Style:   Auditory  Visual  Hands on  No preference indicated   Learning Readiness:   Ready  Recent physical activity:  none  Progress Towards Goal(s):  In progress.  Nutritional Diagnosis:  [place]-3.3 Obesity related to past poor dietary habits and physical inactivity as evidenced by patient attending supervised weight loss for insurance approval of bariatric surgery.    Intervention:  Nutrition counseling provided. Plan: Look into starting water aerobics. Work on cutting back on soda. Continue drinking more water. Keep a water bottle at work. Start working on chewing 20-30 x per bite. Try eat meals and snacks with no distractions.  For smoothies - protein powder, 1 cup or less total fruit, vegetables, nuts/PB2 ice/water/unsweetened almond milk  Teaching Method Utilized:  Visual Auditory Hands on  Barriers to learning/adherence to lifestyle change: none  Demonstrated degree of understanding via:  Teach Back   Monitoring/Evaluation:  Dietary intake, exercise, and body weight. Follow up in 1 months for 12 month supervised weight loss visit.

## 2015-01-28 ENCOUNTER — Encounter: Payer: BLUE CROSS/BLUE SHIELD | Attending: General Surgery | Admitting: Dietician

## 2015-01-28 ENCOUNTER — Encounter: Payer: Self-pay | Admitting: Dietician

## 2015-01-28 DIAGNOSIS — Z6841 Body Mass Index (BMI) 40.0 and over, adult: Secondary | ICD-10-CM | POA: Insufficient documentation

## 2015-01-28 DIAGNOSIS — Z713 Dietary counseling and surveillance: Secondary | ICD-10-CM | POA: Diagnosis not present

## 2015-01-28 NOTE — Progress Notes (Signed)
  12 Months Supervised Weight Loss Visit:   Pre-Operative RYGB Surgery  Medical Nutrition Therapy:  Appt start time: 800 end time:  815.  Primary concerns today: Supervised Weight Loss Visit #2. Returns with a 1 lbs weight loss. She has been moving for the last 2 weeks and had 3 deaths in the family this past. Had been doing good with drinking her water except for the last 2-3 days when she was drinking more tea and coke. Will be all moved in the next 2 weeks. Will be living with her mother who does not keep soda or tea at home. Has found a water aerobics class she is interested in but has not started it yet. Has a lot of support from people at work.   Weight: 379.2 lbs BMI: 57.7  Preferred Learning Style:   Auditory  Visual  Hands on  No preference indicated   Learning Readiness:   Ready  Recent physical activity:  none  Progress Towards Goal(s):  In progress.  Nutritional Diagnosis:  [place]-3.3 Obesity related to past poor dietary habits and physical inactivity as evidenced by patient attending supervised weight loss for insurance approval of bariatric surgery.    Intervention:  Nutrition counseling provided. Plan: Continue drinking more water. Keep a water bottle at work. For recipes, look for meals under 600 calories, 30-45 g of carbs and 20 g of protein. Sign up for water aerobics this month. Continue cutting back on soda. Work on chewing 20-30 x per bite. Start working on avoiding drinking 15 minutes before meals and waiting until 30 minutes after meal.  Continue eating meals and snacks with no distractions.  For smoothies - protein powder, 1 cup or less total fruit, vegetables, nuts/PB2 ice/water/unsweetened almond milk  Teaching Method Utilized:  Visual Auditory Hands on  Barriers to learning/adherence to lifestyle change: none  Demonstrated degree of understanding via:  Teach Back   Monitoring/Evaluation:  Dietary intake, exercise, and body weight. Follow up in  1 months for 12 month supervised weight loss visit.

## 2015-01-28 NOTE — Patient Instructions (Addendum)
Continue drinking more water. Keep a water bottle at work. For recipes, look for meals under 600 calories, 30-45 g of carbs and 20 g of protein. Sign up for water aerobics this month. Continue cutting back on soda. Work on chewing 20-30 x per bite. Start working on avoiding drinking 15 minutes before meals and waiting until 30 minutes after meal.  Continue eating meals and snacks with no distractions.  For smoothies - protein powder, 1 cup or less total fruit, vegetables, nuts/PB2 ice/water/unsweetened almond milk

## 2015-02-04 NOTE — Progress Notes (Signed)
Patient ID: Kathryn Washington, female   DOB: [birth year], 49 y.o.   MRN: [id number]     Cardiology Office Note   Date:  02/05/2015   ID:  Kathryn Washington, DOB 1965-10-08, MRN [id number]  PCP:  Thurman Coyer, MD  Cardiologist:   Jenkins Rouge, MD   No chief complaint on file.     History of Present Illness: Kathryn Washington is a 49 y.o. female who presents for surgical clearance for bariatric surgery CRF HTN  She works for [employer] and is sedentary No chest pain and Only functional dyspnea.  No previous heart issues Has OSA and has not been compliant with CPAP.  Has been through bariatric seminars and seems to be an excellent candidate For gastric bypass not sleeve due to issues with reflux On ASC/diuretic for HTN well controlled   CXR 12/03/14  NAD normal mediastinum no CE  Past Medical History  Diagnosis Date  . Hypertension   . Back pain, chronic   . Obesity   . GERD (gastroesophageal reflux disease)   . Arthritis   . Sleep apnea     Past Surgical History  Procedure Laterality Date  . Cholecystectomy    . Appendectomy    . Tonsillectomy    . Adenoidectomy    . Back surgery       Current Outpatient Prescriptions  Medication Sig Dispense Refill  . chlorpheniramine (CHLOR-TRIMETON) 4 MG tablet Take 4 mg by mouth 2 (two) times daily as needed for allergies.    . cyclobenzaprine (FLEXERIL) 5 MG tablet Take 5 mg by mouth 3 (three) times daily as needed for muscle spasms.    Marland Kitchen lisinopril-hydrochlorothiazide (PRINZIDE,ZESTORETIC) 10-12.5 MG per tablet Take 1 tablet by mouth daily.    . RABEprazole (ACIPHEX) 20 MG tablet Take 20 mg by mouth daily.     No current facility-administered medications for this visit.    Allergies:   Coconut fatty acids    Social History:  The patient  reports that she has never smoked. She does not have any smokeless tobacco history on file. She reports that she does not drink alcohol.   Family History:  The patient's family history  includes Arrhythmia in her father; Cancer in her father; Diabetes in her mother; GER disease in her father; Heart attack in her mother; Hyperlipidemia in her father and mother; Hypertension in her father and mother; Kidney disease in her mother; Other in her brother.    ROS:  Please see the history of present illness.   Otherwise, review of systems are positive for none.   All other systems are reviewed and negative.    PHYSICAL EXAM: VS:  BP 124/70 mmHg  Pulse 96  Ht 5\' 8"  (1.727 m)  Wt 172.82 kg (381 lb)  BMI 57.94 kg/m2  SpO2 98% , BMI Body mass index is 57.94 kg/(m^2). Affect appropriate Obese white female  HEENT: normal Neck supple with no adenopathy JVP normal no bruits no thyromegaly Lungs clear with no wheezing and good diaphragmatic motion Heart:  S1/S2 no murmur, no rub, gallop or click PMI normal Abdomen: benighn, BS positve, no tenderness, no AAA no bruit.  No HSM or HJR Distal pulses intact with no bruits No edema Neuro non-focal Skin warm and dry No muscular weakness    EKG:   12/03/14  SR rate 72  Low voltage normal ST segments    Recent Labs: No results found for requested labs within last 365 days.    Lipid Panel No results  found for: CHOL, TRIG, HDL, CHOLHDL, VLDL, LDLCALC, LDLDIRECT    Wt Readings from Last 3 Encounters:  02/05/15 172.82 kg (381 lb)  01/28/15 172.185 kg (379 lb 9.6 oz)  01/02/15 172.458 kg (380 lb 3.2 oz)      Other studies Reviewed: Additional studies/ records that were reviewed today include:  Epic notes/ECG and labs.    ASSESSMENT AND PLAN:  1.  Pre op:  Clear to have bariatric surgery in 10/2014.  She has no cardiac symptoms normal ECG and normal cardiac exam 2. HTN:  Well controlled.  Continue current medications and low sodium Dash type diet.   3. OSA:  Discussed issues with OSA and cardiac arrhythmias  Encouraged her to be more compliant with CPAP 4. Back Pain:  Limits exercise  Flexeril as needed  Weight loss will  help    Current medicines are reviewed at length with the patient today.  The patient does not have concerns regarding medicines.  The following changes have been made:  no change  Labs/ tests ordered today include: None  No orders of the defined types were placed in this encounter.     Disposition:   FU with me PRN      Signed, Jenkins Rouge, MD  02/05/2015 11:50 AM    [facility] [address], [place], [place]  [address] Phone: [phone]; Fax: [phone]

## 2015-02-05 ENCOUNTER — Ambulatory Visit (INDEPENDENT_AMBULATORY_CARE_PROVIDER_SITE_OTHER): Payer: BLUE CROSS/BLUE SHIELD | Admitting: Cardiovascular Disease

## 2015-02-05 ENCOUNTER — Encounter: Payer: Self-pay | Admitting: Cardiovascular Disease

## 2015-02-05 VITALS — BP 124/70 | HR 96 | Ht 68.0 in | Wt 381.0 lb

## 2015-02-05 DIAGNOSIS — I10 Essential (primary) hypertension: Secondary | ICD-10-CM | POA: Insufficient documentation

## 2015-02-05 DIAGNOSIS — G4733 Obstructive sleep apnea (adult) (pediatric): Secondary | ICD-10-CM | POA: Insufficient documentation

## 2015-02-05 DIAGNOSIS — Z01810 Encounter for preprocedural cardiovascular examination: Secondary | ICD-10-CM

## 2015-02-05 DIAGNOSIS — B1920 Unspecified viral hepatitis C without hepatic coma: Secondary | ICD-10-CM | POA: Insufficient documentation

## 2015-02-05 DIAGNOSIS — K219 Gastro-esophageal reflux disease without esophagitis: Secondary | ICD-10-CM | POA: Insufficient documentation

## 2015-02-05 DIAGNOSIS — K589 Irritable bowel syndrome without diarrhea: Secondary | ICD-10-CM | POA: Insufficient documentation

## 2015-02-05 NOTE — Patient Instructions (Signed)
Medication Instructions:  NO CHANGES  Labwork: NONE  Testing/Procedures: NONE  Follow-Up: AS NEEDED   Any Other Special Instructions Will Be Listed Below (If Applicable).

## 2015-02-09 ENCOUNTER — Emergency Department (HOSPITAL_BASED_OUTPATIENT_CLINIC_OR_DEPARTMENT_OTHER)
Admission: EM | Admit: 2015-02-09 | Discharge: 2015-02-09 | Disposition: A | Discharge status: Home / Self Care | Payer: BLUE CROSS/BLUE SHIELD | Attending: Emergency Medicine | Admitting: Emergency Medicine

## 2015-02-09 ENCOUNTER — Encounter (HOSPITAL_BASED_OUTPATIENT_CLINIC_OR_DEPARTMENT_OTHER): Payer: Self-pay

## 2015-02-09 DIAGNOSIS — L02213 Cutaneous abscess of chest wall: Secondary | ICD-10-CM | POA: Diagnosis present

## 2015-02-09 DIAGNOSIS — G8929 Other chronic pain: Secondary | ICD-10-CM | POA: Insufficient documentation

## 2015-02-09 DIAGNOSIS — K219 Gastro-esophageal reflux disease without esophagitis: Secondary | ICD-10-CM | POA: Diagnosis not present

## 2015-02-09 DIAGNOSIS — E669 Obesity, unspecified: Secondary | ICD-10-CM | POA: Insufficient documentation

## 2015-02-09 DIAGNOSIS — Z79899 Other long term (current) drug therapy: Secondary | ICD-10-CM | POA: Insufficient documentation

## 2015-02-09 DIAGNOSIS — M1990 Unspecified osteoarthritis, unspecified site: Secondary | ICD-10-CM | POA: Insufficient documentation

## 2015-02-09 DIAGNOSIS — I10 Essential (primary) hypertension: Secondary | ICD-10-CM | POA: Insufficient documentation

## 2015-02-09 MED ORDER — HYDROCODONE-ACETAMINOPHEN 5-325 MG PO TABS: 2.0000 | ORAL_TABLET | ORAL | Status: DC | PRN

## 2015-02-09 MED ORDER — LIDOCAINE-EPINEPHRINE 1 %-1:100000 IJ SOLN
INTRAMUSCULAR | Status: AC
  Filled 2015-02-09: qty 1

## 2015-02-09 MED ORDER — DOXYCYCLINE HYCLATE 100 MG PO CAPS: 100.0000 mg | ORAL_CAPSULE | Freq: Two times a day (BID) | ORAL | Status: DC

## 2015-02-09 NOTE — ED Notes (Signed)
Patient here with redness and pain under left breast for the past few days, denies drainage

## 2015-02-09 NOTE — Discharge Instructions (Signed)
Remove 1 inch of gauze per day until the gauze comes out. After gauze is out, start soaks and gentle massage daily.  Abscess An abscess is an infected area that contains a collection of pus and debris.It can occur in almost any part of the body. An abscess is also known as a furuncle or boil. CAUSES  An abscess occurs when tissue gets infected. This can occur from blockage of oil or sweat glands, infection of hair follicles, or a minor injury to the skin. As the body tries to fight the infection, pus collects in the area and creates pressure under the skin. This pressure causes pain. People with weakened immune systems have difficulty fighting infections and get certain abscesses more often.  SYMPTOMS Usually an abscess develops on the skin and becomes a painful mass that is red, warm, and tender. If the abscess forms under the skin, you may feel a moveable soft area under the skin. Some abscesses break open (rupture) on their own, but most will continue to get worse without care. The infection can spread deeper into the body and eventually into the bloodstream, causing you to feel ill.  DIAGNOSIS  Your caregiver will take your medical history and perform a physical exam. A sample of fluid may also be taken from the abscess to determine what is causing your infection. TREATMENT  Your caregiver may prescribe antibiotic medicines to fight the infection. However, taking antibiotics alone usually does not cure an abscess. Your caregiver may need to make a small cut (incision) in the abscess to drain the pus. In some cases, gauze is packed into the abscess to reduce pain and to continue draining the area. HOME CARE INSTRUCTIONS   Only take over-the-counter or prescription medicines for pain, discomfort, or fever as directed by your caregiver.  If you were prescribed antibiotics, take them as directed. Finish them even if you start to feel better.  If gauze is used, follow your caregiver's directions  for changing the gauze.  To avoid spreading the infection:  Keep your draining abscess covered with a bandage.  Wash your hands well.  Do not share personal care items, towels, or whirlpools with others.  Avoid skin contact with others.  Keep your skin and clothes clean around the abscess.  Keep all follow-up appointments as directed by your caregiver. SEEK MEDICAL CARE IF:   You have increased pain, swelling, redness, fluid drainage, or bleeding.  You have muscle aches, chills, or a general ill feeling.  You have a fever. MAKE SURE YOU:   Understand these instructions.  Will watch your condition.  Will get help right away if you are not doing well or get worse. Document Released: 06/16/2005 Document Revised: 03/07/2012 Document Reviewed: 11/19/2011 [facility] Patient Information 2015 [place], [place]. This information is not intended to replace advice given to you by your health care provider. Make sure you discuss any questions you have with your health care provider.

## 2015-02-09 NOTE — ED Provider Notes (Signed)
CSN: [id number]     Arrival date & time 02/09/15  0935 History   First MD Initiated Contact with Patient 02/09/15 1000     Chief Complaint  Patient presents with  . Abscess      HPI Patient presents for evaluation of a red painful area just underneath her left breast on her left chest wall. Present for 5-6 days getting bigger presents for evaluation. Not getting better with warm compresses. No past similar episodes.  Past Medical History  Diagnosis Date  . Hypertension   . Back pain, chronic   . Obesity   . GERD (gastroesophageal reflux disease)   . Arthritis   . Sleep apnea    Past Surgical History  Procedure Laterality Date  . Cholecystectomy    . Appendectomy    . Tonsillectomy    . Adenoidectomy    . Back surgery     Family History  Problem Relation Age of Onset  . Hypertension Mother   . Hyperlipidemia Mother   . Diabetes Mother   . Cancer Father   . GER disease Father   . Hypertension Father   . Hyperlipidemia Father   . Heart attack Mother   . Kidney disease Mother   . Other Brother     DDD  . Arrhythmia Father    History  Substance Use Topics  . Smoking status: Never Smoker   . Smokeless tobacco: Not on file  . Alcohol Use: No   OB History    No data available     Review of Systems  Constitutional: Negative for fever, chills, diaphoresis, appetite change and fatigue.  HENT: Negative for mouth sores, sore throat and trouble swallowing.   Eyes: Negative for visual disturbance.  Respiratory: Negative for cough, chest tightness, shortness of breath and wheezing.   Cardiovascular: Negative for chest pain.  Gastrointestinal: Negative for nausea, vomiting, abdominal pain, diarrhea and abdominal distention.  Endocrine: Negative for polydipsia, polyphagia and polyuria.  Genitourinary: Negative for dysuria, frequency and hematuria.  Musculoskeletal: Negative for gait problem.  Skin: Positive for color change. Negative for pallor and rash.       Area of  tenderness and pain in the left chest underneath her left breast.  Neurological: Negative for dizziness, syncope, light-headedness and headaches.  Hematological: Does not bruise/bleed easily.  Psychiatric/Behavioral: Negative for behavioral problems and confusion.      Allergies  Coconut fatty acids  Home Medications   Prior to Admission medications   Medication Sig Start Date End Date Taking? Authorizing Provider  chlorpheniramine (CHLOR-TRIMETON) 4 MG tablet Take 4 mg by mouth 2 (two) times daily as needed for allergies.    Historical Provider, MD  cyclobenzaprine (FLEXERIL) 5 MG tablet Take 5 mg by mouth 3 (three) times daily as needed for muscle spasms.    Historical Provider, MD  doxycycline (VIBRAMYCIN) 100 MG capsule Take 1 capsule (100 mg total) by mouth 2 (two) times daily. 02/09/15   Tanna Furry, MD  HYDROcodone-acetaminophen (NORCO/VICODIN) 5-325 MG per tablet Take 2 tablets by mouth every 4 (four) hours as needed. 02/09/15   Tanna Furry, MD  lisinopril-hydrochlorothiazide (PRINZIDE,ZESTORETIC) 10-12.5 MG per tablet Take 1 tablet by mouth daily.    Historical Provider, MD  RABEprazole (ACIPHEX) 20 MG tablet Take 20 mg by mouth daily.    Historical Provider, MD   BP 141/82 mmHg  Pulse 94  Temp(Src) 97.8 F (36.6 C) (Oral)  Resp 18  SpO2 90%  LMP 01/11/2015 Physical Exam  Constitutional: She is  oriented to person, place, and time. She appears well-developed and well-nourished. No distress.  HENT:  Head: Normocephalic.  Eyes: Conjunctivae are normal. Pupils are equal, round, and reactive to light. No scleral icterus.  Neck: Normal range of motion. Neck supple. No thyromegaly present.  Cardiovascular: Normal rate and regular rhythm.  Exam reveals no gallop and no friction rub.   No murmur heard. Pulmonary/Chest: Effort normal and breath sounds normal. No respiratory distress. She has no wheezes. She has no rales.    Abdominal: Soft. Bowel sounds are normal. She exhibits no  distension. There is no tenderness. There is no rebound.  Musculoskeletal: Normal range of motion.  Neurological: She is alert and oriented to person, place, and time.  Skin: Skin is warm and dry. No rash noted.  Psychiatric: She has a normal mood and affect. Her behavior is normal.    ED Course  Procedures (including critical care time) Labs Review Labs Reviewed - No data to display  Imaging Review No results found.   EKG Interpretation None      MDM   Final diagnoses:  Chest wall abscess   INCISION AND DRAINAGE Performed by: Lolita Patella Consent: Verbal consent obtained. Risks and benefits: risks, benefits and alternatives were discussed Type: abscess  Body area: lt chest wall inferior to breast  Anesthesia: local infiltration  Incision was made with a scalpel.  Local anesthetic: lidocaine with% with epinephrine  Anesthetic total: 4 ml  Complexity: complex Blunt dissection to break up loculations  Drainage: purulent  Drainage amount: moderate, 34ml  Packing material: 1/4 in iodoform gauze  Patient tolerance: Patient tolerated the procedure well with no immediate complications.       Tanna Furry, MD 02/09/15 [phone]

## 2015-02-25 ENCOUNTER — Encounter: Payer: BLUE CROSS/BLUE SHIELD | Attending: General Surgery | Admitting: Dietician

## 2015-02-25 ENCOUNTER — Encounter: Payer: Self-pay | Admitting: Dietician

## 2015-02-25 DIAGNOSIS — Z713 Dietary counseling and surveillance: Secondary | ICD-10-CM | POA: Insufficient documentation

## 2015-02-25 DIAGNOSIS — Z6841 Body Mass Index (BMI) 40.0 and over, adult: Secondary | ICD-10-CM | POA: Insufficient documentation

## 2015-02-25 NOTE — Progress Notes (Signed)
  12 Months Supervised Weight Loss Visit:   Pre-Operative RYGB Surgery  Medical Nutrition Therapy:  Appt start time: 830 end time:  845.  Primary concerns today: Supervised Weight Loss Visit #3. Returns with a 1 lbs weight loss. Kathryn Washington states she has recently finished moving and is no longer eating fast food like she was. She is in a better environment with her mom. Has not started water aerobics yet. Has been working on not drinking with meals. Recognizes that she needs to work more on chewing thoroughly. Provided a snack list and encouraged frequent meals/snacks with carbs+protein to prevent low blood sugar.   Weight: 378 lbs BMI: 57.6  Preferred Learning Style:   No preference indicated   Learning Readiness:   Ready  Recent physical activity:  none  Progress Towards Goal(s):  In progress.  Nutritional Diagnosis:  [place]-3.3 Obesity related to past poor dietary habits and physical inactivity as evidenced by patient attending supervised weight loss for insurance approval of bariatric surgery.    Intervention:  Nutrition counseling provided. Plan: Continue drinking more water. Keep a water bottle at work. For recipes, look for meals under 600 calories, 30-45 g of carbs and 20 g of protein. Sign up for water aerobics this month. Continue cutting back on soda. Work on chewing 20-30 x per bite. Start working on avoiding drinking 15 minutes before meals and waiting until 30 minutes after meal.  Continue eating meals and snacks with no distractions.  For smoothies - protein powder, 1 cup or less total fruit, vegetables, nuts/PB2 ice/water/unsweetened almond milk Keep healthy snacks available at work  Handouts provided: 15g CHO + protein snack list  Teaching Method Utilized:  Visual Auditory Hands on  Barriers to learning/adherence to lifestyle change: none  Demonstrated degree of understanding via:  Teach Back   Monitoring/Evaluation:  Dietary intake, exercise, and body weight.  Follow up in 1 months for 12 month supervised weight loss visit.

## 2015-02-25 NOTE — Patient Instructions (Addendum)
Continue drinking more water. Keep a water bottle at work. For recipes, look for meals under 600 calories, 30-45 g of carbs and 20 g of protein. Sign up for water aerobics this month. Continue cutting back on soda. Work on chewing 20-30 x per bite. Start working on avoiding drinking 15 minutes before meals and waiting until 30 minutes after meal.  Continue eating meals and snacks with no distractions.  For smoothies - protein powder, 1 cup or less total fruit, vegetables, nuts/PB2 ice/water/unsweetened almond milk Keep healthy snacks available at work

## 2015-03-26 ENCOUNTER — Encounter: Payer: Self-pay | Admitting: Dietician

## 2015-03-26 ENCOUNTER — Encounter: Payer: BLUE CROSS/BLUE SHIELD | Attending: General Surgery | Admitting: Dietician

## 2015-03-26 DIAGNOSIS — Z713 Dietary counseling and surveillance: Secondary | ICD-10-CM | POA: Insufficient documentation

## 2015-03-26 DIAGNOSIS — Z6841 Body Mass Index (BMI) 40.0 and over, adult: Secondary | ICD-10-CM | POA: Diagnosis not present

## 2015-03-26 NOTE — Progress Notes (Signed)
  12 Months Supervised Weight Loss Visit:   Pre-Operative RYGB Surgery  Medical Nutrition Therapy:  Appt start time: 694 end time:  930.  Primary concerns today: Supervised Weight Loss Visit #4. Returns with a 2 lbs weight gain. Planning to start water aerobics next week. Bought bunch of healthy foods over the weekend. Has a friend/neighbor who is a good support and trying lose to weight too. Still working on not drinking with meals and has been working on chewing more. Has has not had soda in the past 3-4 days.   Weight: 380.4 lbs BMI: 57.8  Preferred Learning Style:   No preference indicated   Learning Readiness:   Ready  Recent physical activity:  none  Progress Towards Goal(s):  In progress.  Nutritional Diagnosis:  [place]-3.3 Obesity related to past poor dietary habits and physical inactivity as evidenced by patient attending supervised weight loss for insurance approval of bariatric surgery.    Intervention:  Nutrition counseling provided. Plan: Continue drinking more water. Keep a water bottle at work. For recipes, look for meals under 600 calories, 30-45 g of carbs and 20 g of protein. Sign up for water aerobics this week. Continue cutting back on soda. Work on chewing 20-30 x per bite. Continue working on avoiding drinking 15 minutes before meals and waiting until 30 minutes after meal.  Continue eating meals and snacks with no distractions.  For smoothies - protein powder, 1 cup or less total fruit, vegetables, nuts/PB2 ice/water/unsweetened almond milk Continue to keep healthy snacks available at work Continue working on [employer].  Teaching Method Utilized:  Visual Auditory Hands on  Barriers to learning/adherence to lifestyle change: none  Demonstrated degree of understanding via:  Teach Back   Monitoring/Evaluation:  Dietary intake, exercise, and body weight. Follow up in 1 months for 12 month supervised weight loss visit.

## 2015-03-26 NOTE — Patient Instructions (Addendum)
Continue drinking more water. Keep a water bottle at work. For recipes, look for meals under 600 calories, 30-45 g of carbs and 20 g of protein. Sign up for water aerobics this week. Continue cutting back on soda. Work on chewing 20-30 x per bite. Continue working on avoiding drinking 15 minutes before meals and waiting until 30 minutes after meal.  Continue eating meals and snacks with no distractions.  For smoothies - protein powder, 1 cup or less total fruit, vegetables, nuts/PB2 ice/water/unsweetened almond milk Continue to keep healthy snacks available at work Continue working on [employer].

## 2015-04-28 ENCOUNTER — Encounter: Payer: BLUE CROSS/BLUE SHIELD | Attending: General Surgery | Admitting: Dietician

## 2015-04-28 ENCOUNTER — Encounter: Payer: Self-pay | Admitting: Dietician

## 2015-04-28 DIAGNOSIS — Z713 Dietary counseling and surveillance: Secondary | ICD-10-CM | POA: Diagnosis not present

## 2015-04-28 DIAGNOSIS — Z6841 Body Mass Index (BMI) 40.0 and over, adult: Secondary | ICD-10-CM | POA: Insufficient documentation

## 2015-04-28 NOTE — Patient Instructions (Signed)
For recipes, look for meals under 600 calories, 30-45 g of carbs and 20 g of protein. Try this eggplant recipe: [email or web address] For snacks, have about 15 g of snacks with protein. Work on chewing 20-30 x per bite. Continue working on avoiding drinking 15 minutes before meals and waiting until 30 minutes after meal.  Continue eating meals and snacks with no distractions.  For smoothies - protein powder, 1 cup or less total fruit, vegetables, nuts/PB2 ice/water/unsweetened almond milk Continue working on meal prepping.

## 2015-04-28 NOTE — Progress Notes (Signed)
  12 Months Supervised Weight Loss Visit:   Pre-Operative RYGB Surgery  Medical Nutrition Therapy:  Appt start time: 830 end time:  845.  Primary concerns today: Supervised Weight Loss Visit #5. Returns with an 11 lbs weight loss. Has neck pain which she is going to see the doctor about. Has been working on eating low carb with a friend hers. Has not had any cravings. Started water aerobics.  Has not had any soda this past month.  Still working on not drinking with meals and has been working on chewing more.   Weight: 369.8 lbs BMI: 56.2  Preferred Learning Style:   No preference indicated   Learning Readiness:   Ready  Recent physical activity: water aerobics 3 x week for 55 minutes  Progress Towards Goal(s):  In progress.  Nutritional Diagnosis:  [place]-3.3 Obesity related to past poor dietary habits and physical inactivity as evidenced by patient attending supervised weight loss for insurance approval of bariatric surgery.    Intervention:  Nutrition counseling provided. Plan: For recipes, look for meals under 600 calories, 30-45 g of carbs and 20 g of protein. Try this eggplant recipe: [email or web address] For snacks, have about 15 g of snacks with protein. Work on chewing 20-30 x per bite. Continue working on avoiding drinking 15 minutes before meals and waiting until 30 minutes after meal.  Continue eating meals and snacks with no distractions.  For smoothies - protein powder, 1 cup or less total fruit, vegetables, nuts/PB2 ice/water/unsweetened almond milk Continue working on meal prepping.  Teaching Method Utilized:  Visual Auditory Hands on  Barriers to learning/adherence to lifestyle change: none  Demonstrated degree of understanding via:  Teach Back   Monitoring/Evaluation:  Dietary intake, exercise, and body weight. Follow up in 1 months for 12 month supervised weight loss visit.

## 2015-05-29 ENCOUNTER — Encounter: Payer: Self-pay | Admitting: Dietician

## 2015-05-29 ENCOUNTER — Encounter: Payer: BLUE CROSS/BLUE SHIELD | Attending: General Surgery | Admitting: Dietician

## 2015-05-29 DIAGNOSIS — Z713 Dietary counseling and surveillance: Secondary | ICD-10-CM | POA: Insufficient documentation

## 2015-05-29 DIAGNOSIS — Z6841 Body Mass Index (BMI) 40.0 and over, adult: Secondary | ICD-10-CM | POA: Insufficient documentation

## 2015-05-29 NOTE — Patient Instructions (Signed)
For recipes, look for meals under 600 calories, 30-45 g of carbs and 20 g of protein. Try this eggplant recipe: [email or web address] For snacks, have about 15 g of carbs with protein. Work on chewing 20-30 x per bite. Continue working on avoiding drinking 15 minutes before meals and waiting until 30 minutes after meal.  Continue eating meals and snacks with no distractions.  For smoothies - protein powder, 1 cup or less total fruit, vegetables, nuts/PB2 ice/water/unsweetened almond milk Continue working on meal prepping.

## 2015-05-29 NOTE — Progress Notes (Signed)
  12 Months Supervised Weight Loss Visit:   Pre-Operative RYGB Surgery  Medical Nutrition Therapy:  Appt start time: 830 end time:  845.  Primary concerns today: Supervised Weight Loss Visit #6. Kathryn Washington returns with an 3 lbs weight loss despite a herniated disc and taking prednisone. She is losing the curvature in her neck and plans to start physical therapy. She has not been able to do water aerobics. She has been watching her carb intake and eating small amounts every few hours. Has been drinking mainly water and has given up sodas. Has a few friends that have had bariatric surgery so Kathryn Washington has been talking to them. Kathryn Washington has been living with her mom who has diabetes. She recently got a Ninja blender and plans to use it for her protein shakes. She has been making shakes with unsweetened almond milk, chocolate protein, chocolate PB2, and banana. She understands that she will need to avoid fruit initially after surgery. Kathryn Washington has been practicing chewing food thoroughly and not drinking while eating. She is feeling more prepared for surgery.   Weight: 366.5 lbs BMI: 55.8  Preferred Learning Style:   No preference indicated   Learning Readiness:   Ready  Recent physical activity: none due to neck injury  Progress Towards Goal(s):  In progress.  Nutritional Diagnosis:  [place]-3.3 Obesity related to past poor dietary habits and physical inactivity as evidenced by patient attending supervised weight loss for insurance approval of bariatric surgery.    Intervention:  Nutrition counseling provided. Plan: For recipes, look for meals under 600 calories, 30-45 g of carbs and 20 g of protein. Try this eggplant recipe: [email or web address] For snacks, have about 15 g of snacks with protein. Work on chewing 20-30 x per bite. Continue working on avoiding drinking 15 minutes before meals and waiting until 30 minutes after meal.  Continue eating meals and  snacks with no distractions.  For smoothies - protein powder, 1 cup or less total fruit, vegetables, nuts/PB2 ice/water/unsweetened almond milk Continue working on meal prepping.  Teaching Method Utilized:  Visual Auditory  Barriers to learning/adherence to lifestyle change: none  Demonstrated degree of understanding via:  Teach Back   Monitoring/Evaluation:  Dietary intake, exercise, and body weight. Follow up in 1 months for 12 month supervised weight loss visit.

## 2015-06-25 ENCOUNTER — Encounter: Payer: Self-pay | Admitting: Dietician

## 2015-06-25 ENCOUNTER — Encounter: Payer: BLUE CROSS/BLUE SHIELD | Attending: General Surgery | Admitting: Dietician

## 2015-06-25 DIAGNOSIS — Z6841 Body Mass Index (BMI) 40.0 and over, adult: Secondary | ICD-10-CM | POA: Insufficient documentation

## 2015-06-25 DIAGNOSIS — Z713 Dietary counseling and surveillance: Secondary | ICD-10-CM | POA: Insufficient documentation

## 2015-06-25 NOTE — Progress Notes (Signed)
  12 Months Supervised Weight Loss Visit:   Pre-Operative RYGB Surgery  Medical Nutrition Therapy:  Appt start time: 1050 end time:  1105  Primary concerns today: Supervised Weight Loss Visit #7. Charlsey returns with an 5 lbs weight gain. She feels like she has gained weight due to being on oral steroids. She has been working on not drinking while eating. Finds it easier to avoid fluid before meals but is tempted to drink after meals. Has also been chewing thoroughly and taking tiny bites.   Weight: 371.8 lbs BMI: 56.7   Preferred Learning Style:   No preference indicated   Learning Readiness:   Ready  Recent physical activity: none due to neck injury  Progress Towards Goal(s):  In progress.  Nutritional Diagnosis:  [place]-3.3 Obesity related to past poor dietary habits and physical inactivity as evidenced by patient attending supervised weight loss for insurance approval of bariatric surgery.    Intervention:  Nutrition counseling provided. Plan: For recipes, look for meals under 600 calories, 30-45 g of carbs and 20 g of protein. Try this eggplant recipe: [email or web address] For snacks, have about 15 g of carbs with protein. Practice meal planning and develop a structure Take a list to the grocery store (avoid buying trigger foods - make healthy foods convenient) Work on chewing 20-30 x per bite. Continue working on avoiding drinking 15 minutes before meals and waiting until 30 minutes after meal.  Continue eating meals and snacks with no distractions.  For smoothies - protein powder, 1 cup or less total fruit, vegetables, nuts/PB2 ice/water/unsweetened almond milk Continue working on meal prepping.  Teaching Method Utilized:  Visual Auditory  Barriers to learning/adherence to lifestyle change: none  Demonstrated degree of understanding via:  Teach Back   Monitoring/Evaluation:  Dietary intake, exercise, and body weight. Follow up  in 1 months for 12 month supervised weight loss visit.

## 2015-06-25 NOTE — Patient Instructions (Addendum)
For recipes, look for meals under 600 calories, 30-45 g of carbs and 20 g of protein. Try this eggplant recipe: [email or web address] For snacks, have about 15 g of carbs with protein. Practice meal planning and develop a structure Take a list to the grocery store (avoid buying trigger foods - make healthy foods convenient) Work on chewing 20-30 x per bite. Continue working on avoiding drinking 15 minutes before meals and waiting until 30 minutes after meal.  Continue eating meals and snacks with no distractions.  For smoothies - protein powder, 1 cup or less total fruit, vegetables, nuts/PB2 ice/water/unsweetened almond milk Continue working on meal prepping.

## 2015-07-24 ENCOUNTER — Encounter: Payer: BLUE CROSS/BLUE SHIELD | Attending: General Surgery | Admitting: Dietician

## 2015-07-24 ENCOUNTER — Encounter: Payer: Self-pay | Admitting: Dietician

## 2015-07-24 DIAGNOSIS — Z713 Dietary counseling and surveillance: Secondary | ICD-10-CM | POA: Insufficient documentation

## 2015-07-24 DIAGNOSIS — Z6841 Body Mass Index (BMI) 40.0 and over, adult: Secondary | ICD-10-CM | POA: Insufficient documentation

## 2015-07-24 NOTE — Progress Notes (Signed)
  12 Months Supervised Weight Loss Visit:   Pre-Operative RYGB Surgery  Medical Nutrition Therapy:  Appt start time: 830 end time: 845   Primary concerns today: Supervised Weight Loss Visit #8. Kathryn Washington returns with an 5 lbs weight loss despite being on oral steroid. She has been meal prepping to try to stay on track. Her mom has been doing most of the grocery shopping. Getting a shot in her back and hoping to resume water aerobics. Still practicing not drinking with meals. She bought a Building services engineer that helps her organize recipes and develop grocery lists.    Weight: 366.5 lbs BMI:  55.8  Preferred Learning Style:   No preference indicated   Learning Readiness:   Ready  Recent physical activity: none due to neck injury  Progress Towards Goal(s):  In progress.  Nutritional Diagnosis:  [place]-3.3 Obesity related to past poor dietary habits and physical inactivity as evidenced by patient attending supervised weight loss for insurance approval of bariatric surgery.    Intervention:  Nutrition counseling provided. Plan: For recipes, look for meals under 600 calories, 30-45 g of carbs and 20 g of protein. Try this eggplant recipe: [email or web address] For snacks, have about 15 g of carbs with protein. Practice meal planning and develop a structure Take a list to the grocery store (avoid buying trigger foods - make healthy foods convenient) Work on chewing 20-30 x per bite. Continue working on avoiding drinking 15 minutes before meals and waiting until 30 minutes after meal.  Continue eating meals and snacks with no distractions.  For smoothies - protein powder, 1 cup or less total fruit, vegetables, nuts/PB2 ice/water/unsweetened almond milk Continue working on meal prepping.  Teaching Method Utilized:  Visual Auditory  Barriers to learning/adherence to lifestyle change: none  Demonstrated degree of understanding via:  Teach Back    Monitoring/Evaluation:  Dietary intake, exercise, and body weight. Follow up in 1 months for 12 month supervised weight loss visit.

## 2015-07-24 NOTE — Patient Instructions (Addendum)
For recipes, look for meals under 600 calories, 30-45 g of carbs and 20 g of protein. Try this eggplant recipe: [email or web address] For snacks, have about 15 g of carbs with protein. Practice meal planning and develop a structure Take a list to the grocery store (avoid buying trigger foods - make healthy foods convenient) Work on chewing 20-30 x per bite. Continue working on avoiding drinking 15 minutes before meals and waiting until 30 minutes after meal.  Continue eating meals and snacks with no distractions.  For smoothies - protein powder, 1 cup or less total fruit, vegetables, nuts/PB2 ice/water/unsweetened almond milk

## 2015-08-21 ENCOUNTER — Encounter: Payer: BLUE CROSS/BLUE SHIELD | Attending: General Surgery | Admitting: Dietician

## 2015-08-21 ENCOUNTER — Encounter: Payer: Self-pay | Admitting: Dietician

## 2015-08-21 DIAGNOSIS — Z6841 Body Mass Index (BMI) 40.0 and over, adult: Secondary | ICD-10-CM | POA: Diagnosis not present

## 2015-08-21 DIAGNOSIS — Z713 Dietary counseling and surveillance: Secondary | ICD-10-CM | POA: Diagnosis not present

## 2015-08-21 NOTE — Progress Notes (Signed)
  12 Months Supervised Weight Loss Visit:   Pre-Operative RYGB Surgery  Medical Nutrition Therapy:  Appt start time: 830 end time: 845   Primary concerns today: Supervised Weight Loss Visit #9. Kathryn Washington returns with an 1 lb weight loss. She is having some trouble in her personal life with friends that are not supportive of her having surgery. We discussed the importance of having a supportive environment after bariatric surgery. Support group fliers were provided.   Weight: 365.4 lbs BMI:  55.7  Preferred Learning Style:   No preference indicated   Learning Readiness:   Ready  Recent physical activity: none due to neck injury  Progress Towards Goal(s):  In progress.  Nutritional Diagnosis:  [place]-3.3 Obesity related to past poor dietary habits and physical inactivity as evidenced by patient attending supervised weight loss for insurance approval of bariatric surgery.    Intervention:  Nutrition counseling provided. Plan: For recipes, look for meals under 600 calories, 30-45 g of carbs and 20 g of protein. Try this eggplant recipe: [email or web address] For snacks, have about 15 g of carbs with protein. Practice meal planning and develop a structure Take a list to the grocery store (avoid buying trigger foods - make healthy foods convenient) Work on chewing 20-30 x per bite. Continue working on avoiding drinking 15 minutes before meals and waiting until 30 minutes after meal.  Continue eating meals and snacks with no distractions.  For smoothies - protein powder, 1 cup or less total fruit, vegetables, nuts/PB2 ice/water/unsweetened almond milk Continue working on meal prepping.  Teaching Method Utilized:  Visual Auditory  Barriers to learning/adherence to lifestyle change: none  Demonstrated degree of understanding via:  Teach Back   Monitoring/Evaluation:  Dietary intake, exercise, and body weight. Follow up in 1 months for 12 month  supervised weight loss visit.

## 2015-09-24 ENCOUNTER — Ambulatory Visit: Payer: BLUE CROSS/BLUE SHIELD | Admitting: Dietician

## 2015-09-26 ENCOUNTER — Encounter: Payer: Self-pay | Admitting: Skilled Nursing Facility1

## 2015-09-26 ENCOUNTER — Encounter: Payer: BLUE CROSS/BLUE SHIELD | Attending: General Surgery | Admitting: Skilled Nursing Facility1

## 2015-09-26 DIAGNOSIS — Z6841 Body Mass Index (BMI) 40.0 and over, adult: Secondary | ICD-10-CM | POA: Diagnosis not present

## 2015-09-26 DIAGNOSIS — Z713 Dietary counseling and surveillance: Secondary | ICD-10-CM | POA: Diagnosis not present

## 2015-09-26 NOTE — Progress Notes (Signed)
  12 Months Supervised Weight Loss Visit:   Pre-Operative RYGB Surgery  Medical Nutrition Therapy:  Appt start time: 800 end time: 815   Primary concerns today: Supervised Weight Loss Visit #10. Jahnyla returns with a gain of 8 pounds. She is having some trouble in her personal life with a friend who is unsupportive but she has many other friends and family who are supportive. We discussed the importance of having a supportive environment after bariatric surgery. Pt states she Knew she had gained weight since the holidays. Pt states she currently has bronchitis. Pt states Every body in her life knows she is getting surgery. Pt states she will have to Pick and choose for holidays next year and not do what she did this year. Pt states it is Scary to never be able to turn to certain foods when she wants. Pt states she Drank soda over the holidays. Pt staets she has 2 frineds that have had bypass within the last 2 years that she talks to. Pt states her Moms has diabetes and will eat with her during post op. Pt states Fast food may be her biggest challenge.    Weight: 373 lbs BMI:  56.71  Preferred Learning Style:   No preference indicated   Learning Readiness:   Ready  Recent physical activity:   None due to back pain/neck injury  Progress Towards Goal(s):  In progress.  Nutritional Diagnosis:  [place]-3.3 Obesity related to past poor dietary habits and physical inactivity as evidenced by patient attending supervised weight loss for insurance approval of bariatric surgery.    Intervention:  Nutrition counseling provided. Plan: For recipes, look for meals under 600 calories, 30-45 g of carbs and 20 g of protein. For snacks, have about 15 g of carbs with protein. Practice meal planning and develop a structure Take a list to the grocery store (avoid buying trigger foods - make healthy foods convenient) Work on chewing 20-30 x per bite. Continue working on avoiding drinking 15 minutes before meals  and waiting until 30 minutes after meal.  Continue eating meals and snacks with no distractions.  For smoothies - protein powder, 1 cup or less total fruit, vegetables, nuts/PB2 ice/water/unsweetened almond milk Continue working on meal prepping.  Teaching Method Utilized:  Visual Auditory  Barriers to learning/adherence to lifestyle change: none  Demonstrated degree of understanding via:  Teach Back   Monitoring/Evaluation:  Dietary intake, exercise, and body weight. Follow up in 1 month for 12 month supervised weight loss visit.

## 2015-10-22 ENCOUNTER — Encounter: Payer: Self-pay | Admitting: Dietician

## 2015-10-22 ENCOUNTER — Encounter: Payer: BLUE CROSS/BLUE SHIELD | Attending: General Surgery | Admitting: Dietician

## 2015-10-22 DIAGNOSIS — Z6841 Body Mass Index (BMI) 40.0 and over, adult: Secondary | ICD-10-CM | POA: Insufficient documentation

## 2015-10-22 DIAGNOSIS — Z713 Dietary counseling and surveillance: Secondary | ICD-10-CM | POA: Insufficient documentation

## 2015-10-22 NOTE — Progress Notes (Signed)
  12 Months Supervised Weight Loss Visit:   Pre-Operative RYGB Surgery  Medical Nutrition Therapy:  Appt start time: [id number] end time: 850   Primary concerns today: Supervised Weight Loss Visit #11. Kathryn Washington returns with a loss of 5 pounds. In a lot of pain today with her shoulder. She is getting nervous and excited about surgery. She is most nervous about the first week or 2 after surgery. She has gotten used to not having any sodas and when she took a sip of one when she was sick she found that she did not like the taste. She has been working on being conscious of "everything I put in my mouth."  For example, she has been working on preparing foods at home rather than picking up something from a [employer]. She has also been paying more attention to food labels. She has a friend who has had surgery and they have been looking at bariatric-friendly recipes together.   Weight: 368.1 lbs BMI:  56.1  Preferred Learning Style:   No preference indicated   Learning Readiness:   Ready  Recent physical activity:   None due to back pain/neck injury  Progress Towards Goal(s):  In progress.  Nutritional Diagnosis:  [place]-3.3 Obesity related to past poor dietary habits and physical inactivity as evidenced by patient attending supervised weight loss for insurance approval of bariatric surgery.    Intervention:  Nutrition counseling provided. Plan: For recipes, look for meals under 600 calories, 30-45 g of carbs and 20 g of protein. For snacks, have about 15 g of carbs with protein. Practice meal planning and develop a structure Take a list to the grocery store (avoid buying trigger foods - make healthy foods convenient) Work on chewing 20-30 x per bite. Continue working on avoiding drinking 15 minutes before meals and waiting until 30 minutes after meal.  Continue eating meals and snacks with no distractions.  For smoothies - protein powder, 1 cup or less total fruit, vegetables, nuts/PB2  ice/water/unsweetened almond milk Continue working on meal prepping.  Teaching Method Utilized:  Visual Auditory  Barriers to learning/adherence to lifestyle change: none  Demonstrated degree of understanding via:  Teach Back   Monitoring/Evaluation:  Dietary intake, exercise, and body weight. Follow up in 1 month for 12 month supervised weight loss visit.

## 2015-11-19 ENCOUNTER — Encounter: Payer: BLUE CROSS/BLUE SHIELD | Attending: General Surgery | Admitting: Dietician

## 2015-11-19 ENCOUNTER — Encounter: Payer: Self-pay | Admitting: Dietician

## 2015-11-19 DIAGNOSIS — Z713 Dietary counseling and surveillance: Secondary | ICD-10-CM | POA: Diagnosis not present

## 2015-11-19 DIAGNOSIS — Z6841 Body Mass Index (BMI) 40.0 and over, adult: Secondary | ICD-10-CM | POA: Insufficient documentation

## 2015-11-19 NOTE — Progress Notes (Signed)
  12 Months Supervised Weight Loss Visit:   Pre-Operative RYGB Surgery  Medical Nutrition Therapy:  Appt start time: 925 end time: 940  Primary concerns today: Supervised Weight Loss Visit #12. Angeles returns with a loss of 6 pounds; has lost 20 lbs in the past year. She has been drinking Premier protein shake and feels like it does not taste as good to her as it used to. Plans to try another option. Has still been practicing eating slowly and not drinking fluids with meals. She feels like this has really become a habit for her. Has continued to avoid sodas. I believe that Evanie is an excellent candidate for surgery.  Weight: 361.8 lbs BMI:  55.1  Preferred Learning Style:   No preference indicated   Learning Readiness:   Ready  Recent physical activity:   None due to back pain/neck injury  Progress Towards Goal(s):  In progress.  Nutritional Diagnosis:  [place]-3.3 Obesity related to past poor dietary habits and physical inactivity as evidenced by patient attending supervised weight loss for insurance approval of bariatric surgery.    Intervention:  Nutrition counseling provided. Plan: For recipes, look for meals under 600 calories, 30-45 g of carbs and 20 g of protein. For snacks, have about 15 g of carbs with protein. Practice meal planning and develop a structure Take a list to the grocery store (avoid buying trigger foods - make healthy foods convenient) Work on chewing 20-30 x per bite. Continue working on avoiding drinking 15 minutes before meals and waiting until 30 minutes after meal.  Continue eating meals and snacks with no distractions.  For smoothies - protein powder, 1 cup or less total fruit, vegetables, nuts/PB2 ice/water/unsweetened almond milk Continue working on meal prepping.  Teaching Method Utilized:  Visual Auditory  Barriers to learning/adherence to lifestyle change: none  Demonstrated degree of understanding via:  Teach Back   Monitoring/Evaluation:   Dietary intake, exercise, and body weight. Follow up 3-4 weeks prior to surgery for pre op class.

## 2015-12-29 ENCOUNTER — Ambulatory Visit (INDEPENDENT_AMBULATORY_CARE_PROVIDER_SITE_OTHER): Payer: BLUE CROSS/BLUE SHIELD | Admitting: Pulmonary Disease

## 2015-12-29 ENCOUNTER — Encounter: Payer: Self-pay | Admitting: Pulmonary Disease

## 2015-12-29 ENCOUNTER — Encounter (INDEPENDENT_AMBULATORY_CARE_PROVIDER_SITE_OTHER): Payer: Self-pay

## 2015-12-29 VITALS — BP 128/72 | HR 96 | Ht 68.0 in | Wt 370.0 lb

## 2015-12-29 DIAGNOSIS — G4733 Obstructive sleep apnea (adult) (pediatric): Secondary | ICD-10-CM | POA: Diagnosis not present

## 2015-12-29 DIAGNOSIS — Z01818 Encounter for other preprocedural examination: Secondary | ICD-10-CM | POA: Diagnosis not present

## 2015-12-29 DIAGNOSIS — R0609 Other forms of dyspnea: Secondary | ICD-10-CM

## 2015-12-29 NOTE — Assessment & Plan Note (Signed)
Weight reduction 

## 2015-12-29 NOTE — Progress Notes (Signed)
Subjective:    Patient ID: Kathryn Washington, female    DOB: 12-Sep-1966, 50 y.o.   MRN: [id number]  HPI   This is the case of Kathryn Washington, 49 y.o. Female, who was referred by Dr. Thea Silversmith and Dr. Redmond Pulling in consultation regarding OSA.   As you very well know, patient denies smoking. No history of lung dse.  Has hyeprsomnia, snoring, gasping, witnessed apneas. Hypersomnia affect fxnality.  ESS 12  Pt is seeing Dr. Redmond Pulling from [place] for bariatric surgery. No schedule yet. Cleared by Cardiology.  PSG done in 2012 with severe osa. Never been on CPAP.    Review of Systems  Constitutional: Negative.  Negative for fever and unexpected weight change.       Gained 30 lbs x 5 yrs.   HENT: Positive for congestion, postnasal drip and sinus pressure. Negative for dental problem, ear pain, nosebleeds, rhinorrhea, sneezing, sore throat and trouble swallowing.   Eyes: Positive for redness and itching.  Respiratory: Positive for cough and shortness of breath. Negative for chest tightness and wheezing.   Cardiovascular: Negative.  Negative for palpitations and leg swelling.  Gastrointestinal: Negative.  Negative for nausea and vomiting.  Endocrine: Negative.   Genitourinary: Negative.  Negative for dysuria.  Musculoskeletal: Positive for joint swelling and arthralgias.  Skin: Negative.  Negative for rash.  Allergic/Immunologic: Negative.   Neurological: Positive for headaches.  Hematological: Negative.  Does not bruise/bleed easily.  Psychiatric/Behavioral: Negative.  Negative for dysphoric mood. The patient is not nervous/anxious.   All other systems reviewed and are negative.  Past Medical History  Diagnosis Date  . Hypertension   . Back pain, chronic   . Obesity   . GERD (gastroesophageal reflux disease)   . Arthritis   . Sleep apnea    (-) CA, DVT  Family History  Problem Relation Age of Onset  . Hypertension Mother   . Hyperlipidemia Mother   . Diabetes Mother   . Cancer Father     . GER disease Father   . Hypertension Father   . Hyperlipidemia Father   . Heart attack Mother   . Kidney disease Mother   . Other Brother     DDD  . Arrhythmia Father      Past Surgical History  Procedure Laterality Date  . Cholecystectomy    . Appendectomy    . Tonsillectomy    . Adenoidectomy    . Back surgery      Social History   Social History  . Marital Status: Legally Separated    Spouse Name: N/A  . Number of Children: N/A  . Years of Education: N/A   Occupational History  . Not on file.   Social History Main Topics  . Smoking status: Never Smoker   . Smokeless tobacco: Not on file  . Alcohol Use: No  . Drug Use: Not on file  . Sexual Activity: Not on file   Other Topics Concern  . Not on file   Social History Narrative   single, no childre, no smoking, no ETOH.  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   Allergies  Allergen Reactions  . Coconut Fatty Acids      Outpatient Prescriptions Prior to Visit  Medication Sig Dispense Refill  . chlorpheniramine (CHLOR-TRIMETON) 4 MG tablet Take 4 mg by mouth 2 (two) times daily as needed for allergies.    . cyclobenzaprine (FLEXERIL) 5 MG tablet Take 5 mg by mouth 3 (three) times daily as needed for muscle spasms.    Marland Kitchen  doxycycline (VIBRAMYCIN) 100 MG capsule Take 1 capsule (100 mg total) by mouth 2 (two) times daily. 20 capsule 0  . fluticasone (FLONASE) 50 MCG/ACT nasal spray Place into both nostrils daily.    . formoterol (FORADIL) 12 MCG capsule for inhaler Place 12 mcg into inhaler and inhale every 12 (twelve) hours.    Marland Kitchen lisinopril-hydrochlorothiazide (PRINZIDE,ZESTORETIC) 10-12.5 MG per tablet Take 1 tablet by mouth daily.    . RABEprazole (ACIPHEX) 20 MG tablet Take 20 mg by mouth daily.    Marland Kitchen HYDROcodone-acetaminophen (NORCO/VICODIN) 5-325 MG per tablet Take 2 tablets by mouth every 4 (four) hours as needed. 10 tablet 0  . HYDROcodone-homatropine (HYCODAN) 5-1.5 MG/5ML syrup Take 5 mLs by mouth every 6  (six) hours as needed for cough.    . moxifloxacin (AVELOX) 400 MG tablet Take 400 mg by mouth daily at 8 pm.     No facility-administered medications prior to visit.   Meds ordered this encounter  Medications  . meloxicam (MOBIC) 15 MG tablet    Sig: Take 1 tablet by mouth daily.  . methocarbamol (ROBAXIN) 500 MG tablet    Sig: Take 1 tablet by mouth 2 (two) times daily.  . SUMAtriptan (IMITREX) 50 MG tablet    Sig: Take 1 tablet by mouth as needed.    Refill:  0           Objective:   Physical Exam   Vitals:  Filed Vitals:   12/29/15 0941  BP: 128/72  Pulse: 96  Height: 5\' 8"  (1.727 m)  Weight: 370 lb (167.831 kg)  SpO2: 79%    Constitutional/General:  Pleasant, well-nourished, well-developed, not in any distress,  Comfortably seating.  Well kempt  morbidlly obese  Body mass index is 56.27 kg/(m^2). Wt Readings from Last 3 Encounters:  12/29/15 370 lb (167.831 kg)  11/19/15 361 lb 12.8 oz (164.111 kg)  10/22/15 368 lb 1.6 oz (166.969 kg)    Neck circumference: 19 inches  HEENT: Pupils equal and reactive to light and accommodation. Anicteric sclerae. Normal nasal mucosa.   No oral  lesions,  mouth clear,  oropharynx clear, no postnasal drip. (-) Oral thrush. No dental caries.  Airway - Mallampati class IV  Neck: No masses. Midline trachea. No JVD, (-) LAD. (-) bruits appreciated.  Respiratory/Chest: Grossly normal chest. (-) deformity. (-) Accessory muscle use.  Symmetric expansion. (-) Tenderness on palpation.  Resonant on percussion.  Diminished BS on both lower lung zones. (-) wheezing, crackles, rhonchi (-) egophony  Cardiovascular: Regular rate and  rhythm, heart sounds normal, no murmur or gallops, no peripheral edema  Gastrointestinal:  Normal bowel sounds. Soft, non-tender. No hepatosplenomegaly.  (-) masses.   Musculoskeletal:  Normal muscle tone. Normal gait.   Extremities: Grossly normal. (-) clubbing, cyanosis.  (-) edema  Skin: (-)  rash,lesions seen.   Neurological/Psychiatric : alert, oriented to time, place, person. Normal mood and affect           Assessment & Plan:  OSA (obstructive sleep apnea) Patient had a sleep study done at [facility], December, 2012. AHI 57. Patient has snoring despite CPAP. She was optimal at CPAP of 7 cm water. AHI was 0. Has issues with CPAP mask with too much pressure. Has hypersomnia, snoring fatigue. Plan : 1. Try to order auto CPAP 5-15 cm water. Her sleep study was done in 2012. If auto CPAP is not approved, she will need a home sleep study. 2. If with no good correction, she will need  an in lab sleep study. Need to expedite. 3. Once with good correction and compliance, needs to be cleared for bariatric surgery. Has been cleared by cardiology already. Is seeing Dr. Redmond Pulling. We'll need chest x-ray as well. 4. Anticipate no issues with CPAP.  Morbid obesity ([place]) Weight reduction  Exertional dyspnea Non smoker. Might be related to obesity, RVD. May need ABG. Cardiology cleared pt for bariatric surgery Needs CXR     Thank you very much for letting me participate in this patient's care. Please do not hesitate to give me a call if you have any questions or concerns regarding the treatment plan.   Patient will follow up with me in 6 weeks    J. Shirl Harris, MD 12/29/2015   3:11 PM [facility] [place] Pager: [phone] Office: [phone], Fax: [phone]

## 2015-12-29 NOTE — Patient Instructions (Signed)
1. We will order you an auto CPAP with mask fitting session. Please let us know if you don't get this in a week or so 2. If insurance will not approve the CPAP, you'll need a home sleep study.   Return to clinic in 6 weeks with NP.

## 2015-12-29 NOTE — Assessment & Plan Note (Addendum)
Non smoker. Might be related to obesity, RVD. May need ABG. Cardiology cleared pt for bariatric surgery Needs CXR

## 2015-12-29 NOTE — Assessment & Plan Note (Addendum)
Patient had a sleep study done at [facility], December, 2012. AHI 57. Patient has snoring despite CPAP. She was optimal at CPAP of 7 cm water. AHI was 0. Has issues with CPAP mask with too much pressure. Has hypersomnia, snoring fatigue. Plan : 1. Try to order auto CPAP 5-15 cm water. Her sleep study was done in 2012. If auto CPAP is not approved, she will need a home sleep study. 2. If with no good correction, she will need an in lab sleep study. Need to expedite. 3. Once with good correction and compliance, needs to be cleared for bariatric surgery. Has been cleared by cardiology already. Is seeing Dr. Redmond Pulling. We'll need chest x-ray as well. 4. Anticipate no issues with CPAP.

## 2016-01-05 ENCOUNTER — Telehealth: Payer: Self-pay | Admitting: Pulmonary Disease

## 2016-01-05 DIAGNOSIS — G4733 Obstructive sleep apnea (adult) (pediatric): Secondary | ICD-10-CM

## 2016-01-05 NOTE — Telephone Encounter (Signed)
I have called Melissa, [facility] and had to LVM for her to return my call. We actually faxed the sleep study that the patient brought in on 04/10 to [place] I will ask Melissa to call the patient about the CPAP machine

## 2016-01-06 NOTE — Telephone Encounter (Signed)
Called [facility] and spoke with Barbaraann Rondo, states that the sleep study from [facility] was a cpap pressure titration and not a sleep study- states that since cpap titration study was from 2012 and the sleep study is presumably older than that, a new sleep study will have to be ordered and performed before a new cpap can be ordered through [facility].    AD please advise on which type of sleep study you would like to order.  Thanks.

## 2016-01-07 NOTE — Telephone Encounter (Signed)
pls order a home sleep study. Thanks!  She needs it expedited for her bariatric surgery.  AD

## 2016-01-07 NOTE — Telephone Encounter (Signed)
I have sent order to [facility] for HST to be done asap

## 2016-01-08 ENCOUNTER — Telehealth: Payer: Self-pay | Admitting: Pulmonary Disease

## 2016-01-08 NOTE — Telephone Encounter (Signed)
Spoke with the pt  I advised HST has been ordered  Will forward to [facility] to schedule

## 2016-01-13 ENCOUNTER — Telehealth: Payer: Self-pay | Admitting: Pulmonary Disease

## 2016-01-13 NOTE — Telephone Encounter (Signed)
I called patient and advised we are waiting on approval from her ins.  I have let [facility] know pt has called to inquire about this.  [facility] is going to follow up on precert.  Patient is aware once we get approval from her ins that one of the PCC's will contact her to schedule a time to pick up one of the HST machines.

## 2016-01-19 DIAGNOSIS — G4733 Obstructive sleep apnea (adult) (pediatric): Secondary | ICD-10-CM | POA: Diagnosis not present

## 2016-01-20 ENCOUNTER — Telehealth: Payer: Self-pay | Admitting: Pulmonary Disease

## 2016-01-20 DIAGNOSIS — G4733 Obstructive sleep apnea (adult) (pediatric): Secondary | ICD-10-CM

## 2016-01-20 NOTE — Telephone Encounter (Signed)
  Please call the pt and tell the pt the [place]  showed OSA   Pt stops breathing  31  times an hour.   Please order autoCPAP 5-15 cm H2O. Patient will need a mask fitting session. Patient will need a 1 month download.   Patient needs to be seen 4-6 weeks after obtaining the cpap machine. Let me know if you receive this.   Thanks!   J. Shirl Harris, MD 01/20/2016, 1:39 PM

## 2016-01-21 ENCOUNTER — Other Ambulatory Visit: Payer: Self-pay | Admitting: *Deleted

## 2016-01-21 DIAGNOSIS — G4733 Obstructive sleep apnea (adult) (pediatric): Secondary | ICD-10-CM

## 2016-01-21 NOTE — Telephone Encounter (Signed)
Spoke with pt and gave results and recommendations. Pt agrees to start CPAP therapy. Order placed. Pt aware to call office and schedule f/u appt once starts CPAP. Nothing further needed.  

## 2016-01-23 ENCOUNTER — Telehealth: Payer: Self-pay | Admitting: Pulmonary Disease

## 2016-01-23 NOTE — Telephone Encounter (Signed)
LM x 1 for pt 

## 2016-01-26 NOTE — Telephone Encounter (Signed)
Spoke with pt and advised that order was sent to [facility] on 01/23/16 per [facility] notes.  Advised pt if she has not heard anything within a week to call our office back.  PT verbalized understanding.  Nothing further needed at this time.

## 2016-02-09 ENCOUNTER — Ambulatory Visit: Payer: BLUE CROSS/BLUE SHIELD | Admitting: Acute Care

## 2016-02-12 ENCOUNTER — Ambulatory Visit (INDEPENDENT_AMBULATORY_CARE_PROVIDER_SITE_OTHER): Payer: BLUE CROSS/BLUE SHIELD | Admitting: Acute Care

## 2016-02-12 ENCOUNTER — Encounter: Payer: Self-pay | Admitting: Acute Care

## 2016-02-12 ENCOUNTER — Ambulatory Visit (INDEPENDENT_AMBULATORY_CARE_PROVIDER_SITE_OTHER)
Admission: RE | Admit: 2016-02-12 | Discharge: 2016-02-12 | Disposition: A | Discharge status: Home / Self Care | Payer: BLUE CROSS/BLUE SHIELD | Source: Ambulatory Visit | Attending: Pulmonary Disease | Admitting: Pulmonary Disease

## 2016-02-12 ENCOUNTER — Other Ambulatory Visit: Payer: Self-pay

## 2016-02-12 VITALS — BP 128/80 | HR 75 | Ht 68.0 in | Wt 378.2 lb

## 2016-02-12 DIAGNOSIS — Z01818 Encounter for other preprocedural examination: Secondary | ICD-10-CM

## 2016-02-12 DIAGNOSIS — G4733 Obstructive sleep apnea (adult) (pediatric): Secondary | ICD-10-CM | POA: Diagnosis not present

## 2016-02-12 MED ORDER — ALBUTEROL SULFATE HFA 108 (90 BASE) MCG/ACT IN AERS: 2.0000 | INHALATION_SPRAY | Freq: Four times a day (QID) | RESPIRATORY_TRACT | Status: DC | PRN

## 2016-02-12 NOTE — Assessment & Plan Note (Signed)
   100% Compliant with treatment for OSA Plan: Continue on CPAP at bedtime. You appear to be benefiting from the treatment Goal is to wear for at least 4-6 hours each night for maximal clinical benefit. Continue to work on weight loss, as the link between excess weight  and sleep apnea is well established.  Do not drive if sleepy. Follow up with Dr. Larkin Ina In 3 months or before as needed.  Add an over the counter antihistamine for the nasal stuffiness. Add your NediPot  Continue Flonase Add nasal saline to help with nasal clearing. CXR as was ordered by Dr. Corrie Dandy , please get this on your way out today. We will write an order for a mouth piece to wear while using your CPAP. Please contact office for sooner follow up if symptoms do not improve or worsen or seek emergency care

## 2016-02-12 NOTE — Patient Instructions (Addendum)
It is nice to meet you today. Continue on CPAP at bedtime. You appear to be benefiting from the treatment Goal is to wear for at least 4-6 hours each night for maximal clinical benefit. Continue to work on weight loss, as the link between excess weight  and sleep apnea is well established.  Do not drive if sleepy. Follow up with Dr. Larkin Ina In 3 months or before as needed.  Add an over the counter antihistamine for the nasal stuffiness. Add your NediPot  Continue Flonase Add nasal saline to help with nasal clearing. CXR as was ordered by Dr. Corrie Dandy , please get this on your way out today. We will write an order for a mouth piece to wear while using your CPAP. Please contact office for sooner follow up if symptoms do not improve or worsen or seek emergency care

## 2016-02-12 NOTE — Progress Notes (Signed)
History of Present Illness Kathryn Washington is a 50 y.o. female with OSA referred by Dr. Redmond Pulling ( [facility]) to Dr. Corrie Dandy for CPAP treatment prior to bariatric surgery..   02/12/2016  Follow up for CPAP: Pt. States she is doing well on her CPAP. She was stated on treatment 2 weeks ago.She is wearing it every night, some nights longer than others.She has had some nasal congestion that has made it difficult to wear the mask for long periods of time.She has been taking Advil sinus and that has helped.AHI = 1, She feels she has a good seal with her nasal pillows,she feels the pressures are good.( Autoset 5-15).She is using her device with naps during the day, and has noticed she has stopped snoring.She denies any chest pain, fever, orthopnea, hemoptysis, leg or calf pain.  Download: 01/30/16-02/11/16: Compliance 100% Autoset 5-15 Minimal leaks Nasal Pillows  AHI=1   Tests  02/12/2016:CXR  FINDINGS: The heart is upper normal in size. The lungs are free of focal consolidations and pleural effusions. There is mild lower thoracic spondylosis.  IMPRESSION: No evidence for acute cardiopulmonary abnormality.   Past medical hx Past Medical History  Diagnosis Date  . Hypertension   . Back pain, chronic   . Obesity   . GERD (gastroesophageal reflux disease)   . Arthritis   . Sleep apnea      Past surgical hx, Family hx, Social hx all reviewed.  Current Outpatient Prescriptions on File Prior to Visit  Medication Sig  . chlorpheniramine (CHLOR-TRIMETON) 4 MG tablet Take 4 mg by mouth 2 (two) times daily as needed for allergies.  . cyclobenzaprine (FLEXERIL) 5 MG tablet Take 5 mg by mouth 3 (three) times daily as needed for muscle spasms.  . fluticasone (FLONASE) 50 MCG/ACT nasal spray Place into both nostrils daily.  . formoterol (FORADIL) 12 MCG capsule for inhaler Place 12 mcg into inhaler and inhale every 12 (twelve) hours.  Marland Kitchen lisinopril-hydrochlorothiazide (PRINZIDE,ZESTORETIC)  10-12.5 MG per tablet Take 1 tablet by mouth daily.  . meloxicam (MOBIC) 15 MG tablet Take 1 tablet by mouth daily.  . methocarbamol (ROBAXIN) 500 MG tablet Take 1 tablet by mouth 2 (two) times daily.  . RABEprazole (ACIPHEX) 20 MG tablet Take 20 mg by mouth daily.  . SUMAtriptan (IMITREX) 50 MG tablet Take 1 tablet by mouth as needed.   No current facility-administered medications on file prior to visit.     Allergies  Allergen Reactions  . Coconut Fatty Acids     Review Of Systems:  Constitutional:   No  weight loss, night sweats,  Fevers, chills, fatigue, or  lassitude.  HEENT:   No headaches,  Difficulty swallowing,  Tooth/dental problems, or  Sore throat,                No sneezing, itching, ear ache, + nasal congestion,+  post nasal drip,   CV:  No chest pain,  Orthopnea, PND, swelling in lower extremities, anasarca, dizziness, palpitations, syncope.   GI  No heartburn, indigestion, abdominal pain, nausea, vomiting, diarrhea, change in bowel habits, loss of appetite, bloody stools.   Resp: No shortness of breath with exertion or at rest.  No excess mucus, no productive cough,  No non-productive cough,  No coughing up of blood.  No change in color of mucus.  No wheezing.  No chest wall deformity  Skin: no rash or lesions.  GU: no dysuria, change in color of urine, no urgency or frequency.  No flank pain,  no hematuria   MS:  No joint pain or swelling.  No decreased range of motion.  No back pain.  Psych:  No change in mood or affect. No depression or anxiety.  No memory loss.   Vital Signs BP 128/80 mmHg  Pulse 75  Ht 5\' 8"  (1.727 m)  Wt 378 lb 3.2 oz (171.55 kg)  BMI 57.52 kg/m2  SpO2 97%  LMP 12/23/2015 (Approximate)   Physical Exam:  General- No distress,  A&Ox3, morbidly obese ENT: No sinus tenderness, TM clear, pale nasal mucosa, no oral exudate,+ post nasal drip, no LAN Cardiac: S1, S2, regular rate and rhythm, no murmur Chest: No wheeze/ rales/ dullness;  no accessory muscle use, no nasal flaring, no sternal retractions Abd.: Soft Non-tender Ext: No clubbing cyanosis, edema Neuro:  normal strength Skin: No rashes, warm and dry Psych: normal mood and behavior   Assessment/Plan  OSA (obstructive sleep apnea)   100% Compliant with treatment for OSA Plan: Continue on CPAP at bedtime. You appear to be benefiting from the treatment Goal is to wear for at least 4-6 hours each night for maximal clinical benefit. Continue to work on weight loss, as the link between excess weight  and sleep apnea is well established.  Do not drive if sleepy. Follow up with Dr. Larkin Ina In 3 months or before as needed.  Add an over the counter antihistamine for the nasal stuffiness. Add your NediPot  Continue Flonase Add nasal saline to help with nasal clearing. CXR as was ordered by Dr. Corrie Dandy , please get this on your way out today. We will write an order for a mouth piece to wear while using your CPAP. Please contact office for sooner follow up if symptoms do not improve or worsen or seek emergency care       Magdalen Spatz, NP 02/12/2016  1:40 PM

## 2016-02-23 ENCOUNTER — Encounter: Payer: BLUE CROSS/BLUE SHIELD | Attending: General Surgery

## 2016-02-23 DIAGNOSIS — Z6841 Body Mass Index (BMI) 40.0 and over, adult: Secondary | ICD-10-CM | POA: Insufficient documentation

## 2016-02-23 DIAGNOSIS — Z713 Dietary counseling and surveillance: Secondary | ICD-10-CM | POA: Insufficient documentation

## 2016-02-23 NOTE — Progress Notes (Signed)
  Pre-Operative Nutrition Class:  Appt start time: 830   End time:  930.  Patient was seen on 02/23/2016 for Pre-Operative Bariatric Surgery Education at [facility].   Surgery date:  Surgery type: RYGB Start weight at [facility]: 382 lbs on 12/03/2015 Weight today: 376.6 lbs  TANITA  BODY COMP RESULTS  02/23/16   BMI (kg/m^2) 57.3   Fat Mass (lbs) 224.4   Fat Free Mass (lbs) 152.2   Total Body Water (lbs) N/A   Samples given per MNT protocol. Patient educated on appropriate usage: Premier protein shake (strawberry - qty 1) Lot #: [id number] Exp: 01/2017  Celebrate Vitamins Multivitamin (orange - qty 1) Lot #: [id number] Exp: 08/2016  Bariatric Advantage Calcium Citrate chew (caramel - qty 1) Lot #: [id number] Exp: 07/2016  Unjury Protein Powder (vanilla - qty 1) Lot #: [id number] Exp: 05/2016  The following the learning objectives were met by the patient during this course:  Identify Pre-Op Dietary Goals and will begin 2 weeks pre-operatively  Identify appropriate sources of fluids and proteins   State protein recommendations and appropriate sources pre and post-operatively  Identify Post-Operative Dietary Goals and will follow for 2 weeks post-operatively  Identify appropriate multivitamin and calcium sources  Describe the need for physical activity post-operatively and will follow MD recommendations  State when to call healthcare provider regarding medication questions or post-operative complications  Handouts given during class include:  Pre-Op Bariatric Surgery Diet Handout  Protein Shake Handout  Post-Op Bariatric Surgery Nutrition Handout  BELT Program Information Flyer  Support Group Information Flyer  [facility] Bariatric Supplements Price List  Follow-Up Plan: Patient will follow-up at [facility] 2 weeks post operatively for diet advancement per MD.

## 2016-03-17 ENCOUNTER — Ambulatory Visit: Payer: Self-pay | Admitting: General Surgery

## 2016-03-25 ENCOUNTER — Encounter (HOSPITAL_COMMUNITY): Payer: Self-pay

## 2016-03-25 ENCOUNTER — Encounter (HOSPITAL_COMMUNITY)
Admission: RE | Admit: 2016-03-25 | Discharge: 2016-03-25 | Disposition: A | Discharge status: Home / Self Care | Payer: BLUE CROSS/BLUE SHIELD | Source: Ambulatory Visit | Attending: General Surgery | Admitting: General Surgery

## 2016-03-25 DIAGNOSIS — Z6841 Body Mass Index (BMI) 40.0 and over, adult: Secondary | ICD-10-CM | POA: Diagnosis not present

## 2016-03-25 DIAGNOSIS — Z0183 Encounter for blood typing: Secondary | ICD-10-CM | POA: Diagnosis not present

## 2016-03-25 DIAGNOSIS — Z01812 Encounter for preprocedural laboratory examination: Secondary | ICD-10-CM | POA: Insufficient documentation

## 2016-03-25 HISTORY — DX: Adverse effect of unspecified anesthetic, initial encounter: T41.45XA

## 2016-03-25 HISTORY — DX: Other specified postprocedural states: R11.2

## 2016-03-25 HISTORY — DX: Other complications of anesthesia, initial encounter: T88.59XA

## 2016-03-25 HISTORY — DX: Nausea with vomiting, unspecified: Z98.890

## 2016-03-25 HISTORY — DX: Headache: R51

## 2016-03-25 HISTORY — DX: Headache, unspecified: R51.9

## 2016-03-25 HISTORY — DX: Presence of spectacles and contact lenses: Z97.3

## 2016-03-25 LAB — CBC WITH DIFFERENTIAL/PLATELET
BASOS ABS: 0 10*3/uL (ref 0.0–0.1)
Basophils Relative: 0 %
Eosinophils Absolute: 0.3 10*3/uL (ref 0.0–0.7)
Eosinophils Relative: 2 %
HCT: 42.7 % (ref 36.0–46.0)
HEMOGLOBIN: 14.8 g/dL (ref 12.0–15.0)
LYMPHS ABS: 3.2 10*3/uL (ref 0.7–4.0)
LYMPHS PCT: 30 %
MCH: 29 pg (ref 26.0–34.0)
MCHC: 34.7 g/dL (ref 30.0–36.0)
MCV: 83.6 fL (ref 78.0–100.0)
Monocytes Absolute: 0.6 10*3/uL (ref 0.1–1.0)
Monocytes Relative: 6 %
NEUTROS ABS: 6.3 10*3/uL (ref 1.7–7.7)
NEUTROS PCT: 62 %
PLATELETS: 325 10*3/uL (ref 150–400)
RBC: 5.11 MIL/uL (ref 3.87–5.11)
RDW: 13.8 % (ref 11.5–15.5)
WBC: 10.4 10*3/uL (ref 4.0–10.5)

## 2016-03-25 LAB — COMPREHENSIVE METABOLIC PANEL
ALBUMIN: 4.5 g/dL (ref 3.5–5.0)
ALK PHOS: 80 U/L (ref 38–126)
ALT: 20 U/L (ref 14–54)
AST: 19 U/L (ref 15–41)
Anion gap: 8 (ref 5–15)
BUN: 17 mg/dL (ref 6–20)
CALCIUM: 9.8 mg/dL (ref 8.9–10.3)
CHLORIDE: 102 mmol/L (ref 101–111)
CO2: 29 mmol/L (ref 22–32)
CREATININE: 0.69 mg/dL (ref 0.44–1.00)
GFR calc non Af Amer: >60 mL/min (ref >60)
GLUCOSE: 90 mg/dL (ref 65–99)
Potassium: 3.8 mmol/L (ref 3.5–5.1)
SODIUM: 139 mmol/L (ref 135–145)
Total Bilirubin: 0.5 mg/dL (ref 0.3–1.2)
Total Protein: 7.6 g/dL (ref 6.5–8.1)

## 2016-03-25 LAB — HCG, SERUM, QUALITATIVE: Preg, Serum: NEGATIVE

## 2016-03-25 NOTE — Patient Instructions (Addendum)
Kathryn Washington  03/25/2016   Your procedure is scheduled on: Monday March 29, 2016  Report to [facility]  Entrance take [place]  elevators to 3rd floor to  [place] at 5:15 AM.  Call this number if you have problems the morning of surgery [phone]   Remember: ONLY 1 PERSON MAY GO WITH YOU TO SHORT STAY TO GET  READY MORNING OF [place].  Do not eat food or drink liquids :After Midnight.     Take these medicines the morning of surgery: May use Flonase nasal spray if needed               NO NSAIDS, HERBAL MEDICATIONS  OR ASPIRIN 7 DAYS PRIOR TO SURGERY                                You may not have any metal on your body including hair pins and              piercings  Do not wear jewelry, make-up, lotions, powders or perfumes, deodorant             Do not wear nail polish.  Do not shave  48 hours prior to surgery.               Do not bring valuables to the hospital. [place].  Contacts, dentures or bridgework may not be worn into surgery.  Leave suitcase in the car. After surgery it may be brought to your room.             FOLLOW ANY SPECIFIC BOWEL PREPARATION INSTRUCTIONS GIVEN TO YOU PER SURGEON'S OFFICE PRIOR TO SURGICAL PROCEDURE DATE       _____________________________________________________________________             [facility] - Preparing for Surgery Before surgery, you can play an important role.  Because skin is not sterile, your skin needs to be as free of germs as possible.  You can reduce the number of germs on your skin by washing with CHG (chlorahexidine gluconate) soap before surgery.  CHG is an antiseptic cleaner which kills germs and bonds with the skin to continue killing germs even after washing. Please DO NOT use if you have an allergy to CHG or antibacterial soaps.  If your skin becomes reddened/irritated stop using the CHG and inform your nurse when you arrive at  Short Stay. Do not shave (including legs and underarms) for at least 48 hours prior to the first CHG shower.  You may shave your face/neck. Please follow these instructions carefully:  1.  Shower with CHG Soap the night before surgery and the  morning of Surgery.  2.  If you choose to wash your hair, wash your hair first as usual with your  normal  shampoo.  3.  After you shampoo, rinse your hair and body thoroughly to remove the  shampoo.                           4.  Use CHG as you would any other liquid soap.  You can apply chg directly  to the skin and wash  Gently with a scrungie or clean washcloth.  5.  Apply the CHG Soap to your body ONLY FROM THE NECK DOWN.   Do not use on face/ open                           Wound or open sores. Avoid contact with eyes, ears mouth and genitals (private parts).                       Wash face,  Genitals (private parts) with your normal soap.             6.  Wash thoroughly, paying special attention to the area where your surgery  will be performed.  7.  Thoroughly rinse your body with warm water from the neck down.  8.  DO NOT shower/wash with your normal soap after using and rinsing off  the CHG Soap.                9.  Pat yourself dry with a clean towel.            10.  Wear clean pajamas.            11.  Place clean sheets on your bed the night of your first shower and do not  sleep with pets. Day of Surgery : Do not apply any lotions/deodorants the morning of surgery.  Please wear clean clothes to the hospital/surgery center.  FAILURE TO FOLLOW THESE INSTRUCTIONS MAY RESULT IN THE CANCELLATION OF YOUR SURGERY PATIENT SIGNATURE_________________________________  NURSE SIGNATURE__________________________________  ________________________________________________________________________

## 2016-03-25 NOTE — Progress Notes (Signed)
EKG per chart 12/25/2015

## 2016-03-25 NOTE — Progress Notes (Signed)
Spoke with Amy in portable equipment in regards to need for bari bed order.

## 2016-03-29 ENCOUNTER — Inpatient Hospital Stay (HOSPITAL_COMMUNITY): Payer: BLUE CROSS/BLUE SHIELD | Admitting: Certified Registered Nurse Anesthetist

## 2016-03-29 ENCOUNTER — Encounter (HOSPITAL_COMMUNITY)
Admission: RE | Disposition: A | Discharge status: Home / Self Care | Payer: Self-pay | Source: Ambulatory Visit | Attending: General Surgery

## 2016-03-29 ENCOUNTER — Encounter (HOSPITAL_COMMUNITY): Payer: Self-pay | Admitting: *Deleted

## 2016-03-29 ENCOUNTER — Inpatient Hospital Stay (HOSPITAL_COMMUNITY)
Admission: RE | Admit: 2016-03-29 | Discharge: 2016-03-31 | DRG: 621 | Disposition: A | Discharge status: Home / Self Care | Payer: BLUE CROSS/BLUE SHIELD | Source: Ambulatory Visit | Attending: General Surgery | Admitting: General Surgery

## 2016-03-29 DIAGNOSIS — I10 Essential (primary) hypertension: Secondary | ICD-10-CM | POA: Diagnosis present

## 2016-03-29 DIAGNOSIS — G4733 Obstructive sleep apnea (adult) (pediatric): Secondary | ICD-10-CM | POA: Diagnosis present

## 2016-03-29 DIAGNOSIS — Z87440 Personal history of urinary (tract) infections: Secondary | ICD-10-CM

## 2016-03-29 DIAGNOSIS — Z9884 Bariatric surgery status: Secondary | ICD-10-CM

## 2016-03-29 DIAGNOSIS — K760 Fatty (change of) liver, not elsewhere classified: Secondary | ICD-10-CM | POA: Diagnosis present

## 2016-03-29 DIAGNOSIS — R7303 Prediabetes: Secondary | ICD-10-CM | POA: Diagnosis present

## 2016-03-29 DIAGNOSIS — K660 Peritoneal adhesions (postprocedural) (postinfection): Secondary | ICD-10-CM | POA: Diagnosis present

## 2016-03-29 DIAGNOSIS — K219 Gastro-esophageal reflux disease without esophagitis: Secondary | ICD-10-CM | POA: Diagnosis present

## 2016-03-29 DIAGNOSIS — B182 Chronic viral hepatitis C: Secondary | ICD-10-CM | POA: Diagnosis present

## 2016-03-29 DIAGNOSIS — Z833 Family history of diabetes mellitus: Secondary | ICD-10-CM

## 2016-03-29 DIAGNOSIS — Z8249 Family history of ischemic heart disease and other diseases of the circulatory system: Secondary | ICD-10-CM | POA: Diagnosis not present

## 2016-03-29 DIAGNOSIS — Z9049 Acquired absence of other specified parts of digestive tract: Secondary | ICD-10-CM

## 2016-03-29 DIAGNOSIS — Z6841 Body Mass Index (BMI) 40.0 and over, adult: Secondary | ICD-10-CM

## 2016-03-29 HISTORY — PX: GASTRIC ROUX-EN-Y: SHX5262

## 2016-03-29 LAB — HEMOGLOBIN AND HEMATOCRIT, BLOOD
HCT: 44.3 % (ref 36.0–46.0)
Hemoglobin: 14.8 g/dL (ref 12.0–15.0)

## 2016-03-29 SURGERY — LAPAROSCOPIC ROUX-EN-Y GASTRIC BYPASS WITH UPPER ENDOSCOPY
Anesthesia: General | Site: Abdomen

## 2016-03-29 MED ORDER — LABETALOL HCL 5 MG/ML IV SOLN
INTRAVENOUS | Status: DC | PRN
  Administered 2016-03-29 (×3): 2.5 mg via INTRAVENOUS

## 2016-03-29 MED ORDER — PREMIER PROTEIN SHAKE
2.0000 [oz_av] | ORAL | Status: DC
  Administered 2016-03-31: 2 [oz_av] via ORAL

## 2016-03-29 MED ORDER — ARTIFICIAL TEARS OP OINT
TOPICAL_OINTMENT | OPHTHALMIC | Status: AC
  Filled 2016-03-29: qty 3.5

## 2016-03-29 MED ORDER — MIDAZOLAM HCL 5 MG/5ML IJ SOLN
INTRAMUSCULAR | Status: DC | PRN
  Administered 2016-03-29 (×2): 1 mg via INTRAVENOUS

## 2016-03-29 MED ORDER — ACETAMINOPHEN 10 MG/ML IV SOLN: 1000.0000 mg | Freq: Once | INTRAVENOUS | Status: AC

## 2016-03-29 MED ORDER — ONDANSETRON HCL 4 MG/2ML IJ SOLN
4.0000 mg | INTRAMUSCULAR | Status: DC | PRN
  Administered 2016-03-29: 4 mg via INTRAVENOUS
  Filled 2016-03-29: qty 2

## 2016-03-29 MED ORDER — DEXAMETHASONE SODIUM PHOSPHATE 10 MG/ML IJ SOLN
INTRAMUSCULAR | Status: DC | PRN
  Administered 2016-03-29: 10 mg via INTRAVENOUS

## 2016-03-29 MED ORDER — PROCHLORPERAZINE EDISYLATE 5 MG/ML IJ SOLN: 10.0000 mg | Freq: Four times a day (QID) | INTRAMUSCULAR | Status: DC | PRN

## 2016-03-29 MED ORDER — ATROPINE SULFATE 0.4 MG/ML IJ SOLN
INTRAMUSCULAR | Status: AC
  Filled 2016-03-29: qty 1

## 2016-03-29 MED ORDER — KCL IN DEXTROSE-NACL 20-5-0.45 MEQ/L-%-% IV SOLN
INTRAVENOUS | Status: AC
  Administered 2016-03-29: 1000 mL via INTRAVENOUS
  Filled 2016-03-29: qty 1000

## 2016-03-29 MED ORDER — PROPOFOL 10 MG/ML IV BOLUS
INTRAVENOUS | Status: AC
  Filled 2016-03-29: qty 20

## 2016-03-29 MED ORDER — LACTATED RINGERS IR SOLN
Status: DC | PRN
  Administered 2016-03-29: 3000 mL

## 2016-03-29 MED ORDER — ACETAMINOPHEN 10 MG/ML IV SOLN
1000.0000 mg | Freq: Four times a day (QID) | INTRAVENOUS | Status: AC
  Administered 2016-03-29 – 2016-03-30 (×4): 1000 mg via INTRAVENOUS
  Filled 2016-03-29 (×4): qty 100

## 2016-03-29 MED ORDER — DEXAMETHASONE SODIUM PHOSPHATE 10 MG/ML IJ SOLN
INTRAMUSCULAR | Status: AC
  Filled 2016-03-29: qty 1

## 2016-03-29 MED ORDER — SUGAMMADEX SODIUM 200 MG/2ML IV SOLN
INTRAVENOUS | Status: AC
  Filled 2016-03-29: qty 4

## 2016-03-29 MED ORDER — BUPIVACAINE-EPINEPHRINE 0.25% -1:200000 IJ SOLN
INTRAMUSCULAR | Status: AC
  Filled 2016-03-29: qty 1

## 2016-03-29 MED ORDER — BUPIVACAINE LIPOSOME 1.3 % IJ SUSP
20.0000 mL | Freq: Once | INTRAMUSCULAR | Status: AC
Start: 2016-03-29 — End: 2016-03-29
  Administered 2016-03-29: 20 mL
  Filled 2016-03-29: qty 20

## 2016-03-29 MED ORDER — SUCCINYLCHOLINE CHLORIDE 20 MG/ML IJ SOLN
INTRAMUSCULAR | Status: DC | PRN
  Administered 2016-03-29: 160 mg via INTRAVENOUS

## 2016-03-29 MED ORDER — CEFOTETAN DISODIUM-DEXTROSE 2-2.08 GM-% IV SOLR
INTRAVENOUS | Status: AC
  Filled 2016-03-29: qty 50

## 2016-03-29 MED ORDER — PROMETHAZINE HCL 25 MG/ML IJ SOLN
INTRAMUSCULAR | Status: AC
  Filled 2016-03-29: qty 1

## 2016-03-29 MED ORDER — PROMETHAZINE HCL 25 MG/ML IJ SOLN
6.2500 mg | INTRAMUSCULAR | Status: DC | PRN
  Administered 2016-03-29: 12.5 mg via INTRAVENOUS

## 2016-03-29 MED ORDER — ACETAMINOPHEN 10 MG/ML IV SOLN
INTRAVENOUS | Status: AC
  Filled 2016-03-29: qty 100

## 2016-03-29 MED ORDER — 0.9 % SODIUM CHLORIDE (POUR BTL) OPTIME
TOPICAL | Status: DC | PRN
  Administered 2016-03-29: 1000 mL

## 2016-03-29 MED ORDER — OXYCODONE HCL 5 MG/5ML PO SOLN
5.0000 mg | ORAL | Status: DC | PRN
  Administered 2016-03-30: 10 mg via ORAL
  Administered 2016-03-30 (×2): 5 mg via ORAL
  Administered 2016-03-30 – 2016-03-31 (×2): 10 mg via ORAL
  Filled 2016-03-29: qty 25
  Filled 2016-03-29 (×3): qty 10
  Filled 2016-03-29: qty 5

## 2016-03-29 MED ORDER — LABETALOL HCL 5 MG/ML IV SOLN
INTRAVENOUS | Status: AC
  Filled 2016-03-29: qty 4

## 2016-03-29 MED ORDER — LIDOCAINE HCL (CARDIAC) 20 MG/ML IV SOLN
INTRAVENOUS | Status: DC | PRN
  Administered 2016-03-29: 100 mg via INTRAVENOUS

## 2016-03-29 MED ORDER — BUPIVACAINE-EPINEPHRINE 0.25% -1:200000 IJ SOLN
INTRAMUSCULAR | Status: DC | PRN
  Administered 2016-03-29: 30 mL

## 2016-03-29 MED ORDER — MIDAZOLAM HCL 2 MG/2ML IJ SOLN
INTRAMUSCULAR | Status: AC
  Filled 2016-03-29: qty 2

## 2016-03-29 MED ORDER — ONDANSETRON HCL 4 MG/2ML IJ SOLN
INTRAMUSCULAR | Status: DC | PRN
  Administered 2016-03-29: 4 mg via INTRAVENOUS

## 2016-03-29 MED ORDER — FAMOTIDINE IN NACL 20-0.9 MG/50ML-% IV SOLN
20.0000 mg | Freq: Two times a day (BID) | INTRAVENOUS | Status: DC
  Administered 2016-03-29 – 2016-03-30 (×3): 20 mg via INTRAVENOUS
  Filled 2016-03-29 (×7): qty 50

## 2016-03-29 MED ORDER — LACTATED RINGERS IV SOLN
INTRAVENOUS | Status: DC | PRN
  Administered 2016-03-29 (×2): via INTRAVENOUS

## 2016-03-29 MED ORDER — CEFOTETAN DISODIUM-DEXTROSE 2-2.08 GM-% IV SOLR
2.0000 g | INTRAVENOUS | Status: AC
  Administered 2016-03-29: 2 g via INTRAVENOUS

## 2016-03-29 MED ORDER — HYDROMORPHONE HCL 1 MG/ML IJ SOLN
INTRAMUSCULAR | Status: AC
  Filled 2016-03-29: qty 1

## 2016-03-29 MED ORDER — KCL IN DEXTROSE-NACL 20-5-0.45 MEQ/L-%-% IV SOLN
INTRAVENOUS | Status: DC
  Administered 2016-03-29 – 2016-03-31 (×4): 1000 mL via INTRAVENOUS
  Filled 2016-03-29 (×8): qty 1000

## 2016-03-29 MED ORDER — FLUTICASONE PROPIONATE 50 MCG/ACT NA SUSP
2.0000 | Freq: Every day | NASAL | Status: DC
  Administered 2016-03-29 – 2016-03-30 (×2): 2 via NASAL
  Filled 2016-03-29: qty 16

## 2016-03-29 MED ORDER — TISSEEL VH 10 ML EX KIT
PACK | CUTANEOUS | Status: AC
  Filled 2016-03-29: qty 1

## 2016-03-29 MED ORDER — EPHEDRINE SULFATE 50 MG/ML IJ SOLN
INTRAMUSCULAR | Status: AC
  Filled 2016-03-29: qty 1

## 2016-03-29 MED ORDER — SODIUM CHLORIDE 0.9 % IJ SOLN
INTRAMUSCULAR | Status: AC
  Filled 2016-03-29: qty 50

## 2016-03-29 MED ORDER — SODIUM CHLORIDE 0.9 % IJ SOLN
INTRAMUSCULAR | Status: DC | PRN
  Administered 2016-03-29: 50 mL

## 2016-03-29 MED ORDER — PROPOFOL 10 MG/ML IV BOLUS
INTRAVENOUS | Status: DC | PRN
  Administered 2016-03-29: 200 mg via INTRAVENOUS

## 2016-03-29 MED ORDER — ACETAMINOPHEN 160 MG/5ML PO SOLN: 325.0000 mg | ORAL | Status: DC | PRN

## 2016-03-29 MED ORDER — ACETAMINOPHEN 10 MG/ML IV SOLN
INTRAVENOUS | Status: DC | PRN
  Administered 2016-03-29: 1000 mg via INTRAVENOUS

## 2016-03-29 MED ORDER — ONDANSETRON HCL 4 MG/2ML IJ SOLN
INTRAMUSCULAR | Status: AC
  Filled 2016-03-29: qty 2

## 2016-03-29 MED ORDER — SODIUM CHLORIDE 0.9 % IV SOLN: 70.0000 mL | Freq: Once | INTRAVENOUS | Status: DC

## 2016-03-29 MED ORDER — HYDROMORPHONE HCL 1 MG/ML IJ SOLN
0.2500 mg | INTRAMUSCULAR | Status: DC | PRN
  Administered 2016-03-29: 0.5 mg via INTRAVENOUS

## 2016-03-29 MED ORDER — SODIUM CHLORIDE 0.9 % IJ SOLN
INTRAMUSCULAR | Status: AC
  Filled 2016-03-29: qty 10

## 2016-03-29 MED ORDER — FENTANYL CITRATE (PF) 250 MCG/5ML IJ SOLN
INTRAMUSCULAR | Status: AC
Start: 2016-03-29 — End: 2016-03-29
  Filled 2016-03-29: qty 5

## 2016-03-29 MED ORDER — LIDOCAINE HCL (CARDIAC) 20 MG/ML IV SOLN
INTRAVENOUS | Status: AC
  Filled 2016-03-29: qty 5

## 2016-03-29 MED ORDER — FENTANYL CITRATE (PF) 250 MCG/5ML IJ SOLN
INTRAMUSCULAR | Status: AC
  Filled 2016-03-29: qty 5

## 2016-03-29 MED ORDER — HEPARIN SODIUM (PORCINE) 5000 UNIT/ML IJ SOLN
5000.0000 [IU] | INTRAMUSCULAR | Status: AC
  Administered 2016-03-29: 5000 [IU] via SUBCUTANEOUS
  Filled 2016-03-29: qty 1

## 2016-03-29 MED ORDER — MORPHINE SULFATE (PF) 2 MG/ML IV SOLN
2.0000 mg | INTRAVENOUS | Status: DC | PRN
  Administered 2016-03-29 (×2): 4 mg via INTRAVENOUS
  Administered 2016-03-29: 2 mg via INTRAVENOUS
  Administered 2016-03-29 – 2016-03-30 (×3): 4 mg via INTRAVENOUS
  Filled 2016-03-29 (×3): qty 2
  Filled 2016-03-29: qty 1
  Filled 2016-03-29 (×2): qty 2

## 2016-03-29 MED ORDER — ROCURONIUM BROMIDE 100 MG/10ML IV SOLN
INTRAVENOUS | Status: DC | PRN
  Administered 2016-03-29: 10 mg via INTRAVENOUS
  Administered 2016-03-29: 30 mg via INTRAVENOUS
  Administered 2016-03-29: 10 mg via INTRAVENOUS
  Administered 2016-03-29: 40 mg via INTRAVENOUS

## 2016-03-29 MED ORDER — ACETAMINOPHEN 160 MG/5ML PO SOLN
650.0000 mg | ORAL | Status: DC | PRN
Start: 2016-03-30 — End: 2016-03-31

## 2016-03-29 MED ORDER — FENTANYL CITRATE (PF) 100 MCG/2ML IJ SOLN
INTRAMUSCULAR | Status: DC | PRN
  Administered 2016-03-29 (×2): 100 ug via INTRAVENOUS
  Administered 2016-03-29 (×4): 50 ug via INTRAVENOUS

## 2016-03-29 MED ORDER — ENOXAPARIN SODIUM 40 MG/0.4ML ~~LOC~~ SOLN
40.0000 mg | Freq: Two times a day (BID) | SUBCUTANEOUS | Status: DC
  Administered 2016-03-30 – 2016-03-31 (×3): 40 mg via SUBCUTANEOUS
  Filled 2016-03-29 (×4): qty 0.4

## 2016-03-29 MED ORDER — SUGAMMADEX SODIUM 500 MG/5ML IV SOLN
INTRAVENOUS | Status: DC | PRN
  Administered 2016-03-29: 400 mg via INTRAVENOUS

## 2016-03-29 SURGICAL SUPPLY — 79 items
ADH SKN CLS APL DERMABOND .7 (GAUZE/BANDAGES/DRESSINGS) ×1
APL SRG 32X5 SNPLK LF DISP (MISCELLANEOUS) ×1
APPLICATOR COTTON TIP 6IN STRL (MISCELLANEOUS) ×2 IMPLANT
BLADE SURG SZ11 CARB STEEL (BLADE) ×3 IMPLANT
CABLE HIGH FREQUENCY MONO STRZ (ELECTRODE) ×2 IMPLANT
CHLORAPREP W/TINT 26ML (MISCELLANEOUS) ×6 IMPLANT
CLIP SUT LAPRA TY ABSORB (SUTURE) ×6 IMPLANT
COVER SURGICAL LIGHT HANDLE (MISCELLANEOUS) ×3 IMPLANT
CUTTER FLEX LINEAR 45M (STAPLE) ×2 IMPLANT
DERMABOND ADVANCED (GAUZE/BANDAGES/DRESSINGS) ×2
DERMABOND ADVANCED .7 DNX12 (GAUZE/BANDAGES/DRESSINGS) IMPLANT
DEVICE SUT QUICK LOAD TK 5 (STAPLE) IMPLANT
DEVICE SUT TI-KNOT TK 5X26 (MISCELLANEOUS) IMPLANT
DEVICE SUTURE ENDOST 10MM (ENDOMECHANICALS) ×3 IMPLANT
DEVICE TI KNOT TK5 (MISCELLANEOUS)
DRAIN PENROSE 18X1/4 LTX STRL (WOUND CARE) ×3 IMPLANT
ELECT REM PT RETURN 9FT ADLT (ELECTROSURGICAL) ×3
ELECTRODE REM PT RTRN 9FT ADLT (ELECTROSURGICAL) ×1 IMPLANT
GAUZE SPONGE 4X4 12PLY STRL (GAUZE/BANDAGES/DRESSINGS) IMPLANT
GAUZE SPONGE 4X4 16PLY XRAY LF (GAUZE/BANDAGES/DRESSINGS) ×3 IMPLANT
GLOVE BIOGEL M STRL SZ7.5 (GLOVE) IMPLANT
GOWN STRL REUS W/TWL XL LVL3 (GOWN DISPOSABLE) ×12 IMPLANT
HOVERMATT SINGLE USE (MISCELLANEOUS) ×3 IMPLANT
KIT BASIN OR (CUSTOM PROCEDURE TRAY) ×3 IMPLANT
KIT GASTRIC LAVAGE 34FR ADT (SET/KITS/TRAYS/PACK) ×3 IMPLANT
LUBRICANT JELLY K Y 4OZ (MISCELLANEOUS) ×3 IMPLANT
MARKER SKIN DUAL TIP RULER LAB (MISCELLANEOUS) ×3 IMPLANT
NDL SPNL 22GX3.5 QUINCKE BK (NEEDLE) ×1 IMPLANT
NEEDLE SPNL 22GX3.5 QUINCKE BK (NEEDLE) ×3 IMPLANT
PACK CARDIOVASCULAR III (CUSTOM PROCEDURE TRAY) ×3 IMPLANT
QUICK LOAD TK 5 (STAPLE)
RELOAD 45 VASCULAR/THIN (ENDOMECHANICALS) ×3 IMPLANT
RELOAD ENDO STITCH 2.0 (ENDOMECHANICALS) ×30
RELOAD STAPLE 45 2.5 WHT GRN (ENDOMECHANICALS) IMPLANT
RELOAD STAPLE 45 3.5 BLU ETS (ENDOMECHANICALS) IMPLANT
RELOAD STAPLE 60 2.6 WHT THN (STAPLE) IMPLANT
RELOAD STAPLE 60 3.6 BLU REG (STAPLE) ×2 IMPLANT
RELOAD STAPLE 60 3.8 GOLD REG (STAPLE) ×1 IMPLANT
RELOAD STAPLE TA45 3.5 REG BLU (ENDOMECHANICALS) IMPLANT
RELOAD STAPLER BLUE 60MM (STAPLE) ×4 IMPLANT
RELOAD STAPLER GOLD 60MM (STAPLE) ×1 IMPLANT
RELOAD STAPLER WHITE 60MM (STAPLE) IMPLANT
RELOAD SUT SNGL STCH ABSRB 2-0 (ENDOMECHANICALS) ×4 IMPLANT
RELOAD SUT SNGL STCH BLK 2-0 (ENDOMECHANICALS) ×4 IMPLANT
SCISSORS LAP 5X45 EPIX DISP (ENDOMECHANICALS) ×3 IMPLANT
SEALANT SURGICAL APPL DUAL CAN (MISCELLANEOUS) ×3 IMPLANT
SET IRRIG TUBING LAPAROSCOPIC (IRRIGATION / IRRIGATOR) ×3 IMPLANT
SHEARS HARMONIC ACE PLUS 45CM (MISCELLANEOUS) ×3 IMPLANT
SLEEVE ADV FIXATION 12X100MM (TROCAR) ×6 IMPLANT
SLEEVE XCEL OPT CAN 5 100 (ENDOMECHANICALS) IMPLANT
SOLUTION ANTI FOG 6CC (MISCELLANEOUS) ×3 IMPLANT
STAPLER ECHELON BIOABSB 60 FLE (MISCELLANEOUS) IMPLANT
STAPLER ECHELON LONG 60 440 (INSTRUMENTS) ×3 IMPLANT
STAPLER RELOAD BLUE 60MM (STAPLE) ×12
STAPLER RELOAD GOLD 60MM (STAPLE) ×3
STAPLER RELOAD WHITE 60MM (STAPLE)
SUT MNCRL AB 4-0 PS2 18 (SUTURE) ×5 IMPLANT
SUT RELOAD ENDO STITCH 2 48X1 (ENDOMECHANICALS) ×6
SUT RELOAD ENDO STITCH 2.0 (ENDOMECHANICALS) ×4
SUT SURGIDAC NAB ES-9 0 48 120 (SUTURE) IMPLANT
SUT VIC AB 2-0 SH 27 (SUTURE) ×6
SUT VIC AB 2-0 SH 27X BRD (SUTURE) ×1 IMPLANT
SUTURE RELOAD END STTCH 2 48X1 (ENDOMECHANICALS) ×6 IMPLANT
SUTURE RELOAD ENDO STITCH 2.0 (ENDOMECHANICALS) ×4 IMPLANT
SYR 10ML ECCENTRIC (SYRINGE) ×3 IMPLANT
SYR 20CC LL (SYRINGE) ×8 IMPLANT
TOWEL OR 17X26 10 PK STRL BLUE (TOWEL DISPOSABLE) ×3 IMPLANT
TOWEL OR NON WOVEN STRL DISP B (DISPOSABLE) ×3 IMPLANT
TRAY FOLEY W/METER SILVER 14FR (SET/KITS/TRAYS/PACK) ×3 IMPLANT
TRAY FOLEY W/METER SILVER 16FR (SET/KITS/TRAYS/PACK) ×3 IMPLANT
TROCAR ADV FIXATION 12X100MM (TROCAR) ×3 IMPLANT
TROCAR ADV FIXATION 5X100MM (TROCAR) ×3 IMPLANT
TROCAR BLADELESS OPT 5 100 (ENDOMECHANICALS) ×3 IMPLANT
TROCAR XCEL 12X100 BLDLESS (ENDOMECHANICALS) ×3 IMPLANT
TUBING CONNECTING 10 (TUBING) ×1 IMPLANT
TUBING CONNECTING 10' (TUBING) ×1
TUBING ENDO SMARTCAP PENTAX (MISCELLANEOUS) ×3 IMPLANT
TUBING INSUF HEATED (TUBING) ×3 IMPLANT
WATER STERILE IRR 1000ML POUR (IV SOLUTION) ×2 IMPLANT

## 2016-03-29 NOTE — H&P (Signed)
Kathryn Washington is an 50 y.o. female.   Chief Complaint: here for surgery HPI: The patient is a 50 year old female who presents for a bariatric surgery evaluation. She comes in for long-term follow-up. She has completed 12 months supervised weight loss. Since it had been over 12 months since I initially met her I wanted to see her in person before we submitted her chart. She states that she has done well. The biggest change in her medical history is now she is using CPAP. She ended up getting set up with another sleep medicine clinic and was found to have severe obstructive sleep apnea and got titrated on CPAP. She has had one month follow-up and has had excellent compliance with her CPAP. She reports more daytime energy and less fatigue since starting CPAP. She followed up with her liver medicine physician back in November and she states that there is no blood evidence of ongoing hepatitis C. She had an ultrasound of her abdomen during her workup which showed no evidence of fatty liver disease. She had a prior upper endoscopy by an outside surgeon that showed some mild bile reflux in her stomach and perhaps a small hiatal hernia. Her blood work last summer showed a hemoglobin A1c of 5.7, total cholesterol level of 154, triglyceride level 85, HDL level 42, LDL level 95. She denies any chest pain or chest pressure. She denies any jaw pain in her left upper extremity pain. She denies any shortness of breath or not knee up. She does have some dyspnea on exertion. She denies any TIAs or amaurosis fugax. She saw Dr. Johnsie Cancel in cardiology last year and we received cardiac clearance.. She does have some mild reflux.   10/30/2014 She is referred by Dr Gwenlyn Perking to discuss weight loss surgery. She also sees Roosevelt Locks, NP with [facility] in addition to Dr Lynann Bologna with orthopedics. She is debating between sleeve gastrectomy and gastric bypass. She attended our seminar in person. She states she  has friends who have undergone weight loss surgery. She has tried on several occasions to lose weight but has not had any long term success. She lost over 117 pounds at age 12 with a prescription weight loss medication. She has also done weight watchers, low calorie diets, slimfast, susan powers.  She did pursue weight loss surgery evaluation with Dr Darnell Level in 2014 at [facility]; however she states she felt she was "just a number there" and it seemed impersonal.  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  She is being treated for chronic hep C with [facility]. In a note from Ms. Beverley Fiedler, she states the patient has non-alcoholic fatty liver disease and hep C and based on elastography of her liver - 'risk of fibrosis high'  She denies CP, SOB, orthopnea, edema. She endorses some DOE and PND. She currently doesn't use CPAP. She does have reflux & reports a chronic cough. She states she had an EGD with Dr Darnell Level as part of her evaluation with their program. She has had a lap chole. She denies diarrhea & constipation. She reports she is getting treatment for Hep C and has a low viral load and has an additional 8-12 weeks of treatment.  She reports ongoing menstrual periods. She states she needs a spinal fusion because of her DDD in her back but is not  a candidate given her obesity. She denies any tobacco use.  She reports she couldn't tolerate the taste of the oral contrast for the UGI when ordered by Dr Darnell Level. She underwent an EGD with bx on 11/23/12 at [facility] by Dr Darnell Level. It showed minimal evidence of hiatal hernia. She had some scant bile in the stomach along with scant mild linear red patches in distal stomach - otherwise normal. Biopsy revealed chemical gastritis, reactive gastropathy - negative h pylori.  She had severe OSA syndrome with an  elevated AHI of 53 when [facility]  I reviewed some hep C labs from 08/18/2014. Her HepC rna quantitative load was 48623, rna log was 4.69. Her cbc & coags were normal. Her CMET was normal except for glucose 117, AST 53, ALT 97. Her transaminases were normal in 10/2012 when checked by Dr Darnell Level. Her fibrosis stage on blood work was F0 - no fibrosis.  She has had labs from 10/16/14 which showed wbc 13, hgb 15.9, hct 46.2, plt 412; normal coags, nml BMET, total bilirubin 1.4, direct bili 0.4, AP 136 (increased) and normal AST (14) and ALT (29)   Past Medical History  Diagnosis Date  . Hypertension   . Back pain, chronic   . Obesity   . GERD (gastroesophageal reflux disease)   . Arthritis   . Complication of anesthesia     pt states when had colonscopy and hysterscopy awaken during procedure   . PONV (postoperative nausea and vomiting)   . Sleep apnea     uses CPAP  . Wears glasses   . Tinnitus     comes and goes   . History of vertigo   . History of bronchitis   . Pneumonia   . History of urinary tract infection   . Headache     history of migraines; has prescription medications to use when needed   . Hepatitis     history of hepatitis C  . Miscarriage     Past Surgical History  Procedure Laterality Date  . Cholecystectomy    . Appendectomy    . Tonsillectomy    . Adenoidectomy    . Back surgery    . Tubes placed in ears bilat     . Hysterscopy       Family History  Problem Relation Age of Onset  . Hypertension Mother   . Hyperlipidemia Mother   . Diabetes Mother   . Cancer Father   . GER disease Father   . Hypertension Father   . Hyperlipidemia Father   . Heart attack Mother   . Kidney disease Mother   . Other Brother     DDD  . Arrhythmia Father    Social History:  reports that she has never smoked. She has never used smokeless tobacco. She reports that she does not drink alcohol or use illicit drugs.  Allergies:  Allergies   Allergen Reactions  . Coconut Fatty Acids Hives    Medications Prior to Admission  Medication Sig Dispense Refill  . chlorpheniramine (CHLOR-TRIMETON) 4 MG tablet Take 4 mg by mouth 2 (two) times daily as needed for allergies.    . Cholecalciferol (VITAMIN D3) 5000 units CAPS Take 2 capsules by mouth daily.    . cyclobenzaprine (FLEXERIL) 5 MG tablet Take 5 mg by mouth 3 (three) times daily as needed for muscle spasms.    . fluticasone (FLONASE) 50 MCG/ACT nasal spray Place 2 sprays into both nostrils daily.     Marland Kitchen  lisinopril-hydrochlorothiazide (PRINZIDE,ZESTORETIC) 10-12.5 MG per tablet Take 1 tablet by mouth daily.    . methocarbamol (ROBAXIN) 500 MG tablet Take 1 tablet by mouth 2 (two) times daily.    . naproxen sodium (ANAPROX) 220 MG tablet Take 220 mg by mouth 2 (two) times daily as needed.    . Pseudoephedrine-Ibuprofen (IBUPROFEN COLD & SINUS PO) Take 1 tablet by mouth daily as needed.    . RABEprazole (ACIPHEX) 20 MG tablet Take 20 mg by mouth daily.    . SUMAtriptan (IMITREX) 50 MG tablet Take 1 tablet by mouth every 2 (two) hours as needed for migraine.   0    No results found for this or any previous visit (from the past 48 hour(s)). No results found.  Review of Systems  All other systems reviewed and are negative.  See HPI Blood pressure 128/76, pulse 99, temperature 98.4 F (36.9 C), temperature source Oral, resp. rate 18, height _0  (1.727 m), weight 165.79 kg (365 lb 8 oz), SpO2 99 %. Physical Exam  Vitals reviewed. Constitutional: She is oriented to person, place, and time. She appears well-developed and well-nourished. No distress.  Morbidly obese  HENT:  Head: Normocephalic and atraumatic.  Right Ear: External ear normal.  Left Ear: External ear normal.  Eyes: Conjunctivae are normal. No scleral icterus.  Neck: Normal range of motion. Neck supple. No tracheal deviation present. No thyromegaly present.  Cardiovascular: Normal rate and normal heart sounds.    Respiratory: Effort normal and breath sounds normal. No stridor. No respiratory distress. She has no wheezes.  GI:  Well healed trocar sites  Musculoskeletal: She exhibits no edema or tenderness.  Lymphadenopathy:    She has no cervical adenopathy.  Neurological: She is alert and oriented to person, place, and time. She exhibits normal muscle tone.  Skin: Skin is warm and dry. No rash noted. She is not diaphoretic. No erythema. No pallor.  Psychiatric: She has a normal mood and affect. Her behavior is normal. Judgment and thought content normal.     Assessment/Plan We reviewed her outside records including her workup. Cardiology has cleared her. She is now on CPAP which makes surgery safer. Therefore I believe it is acceptable to proceed with weight loss surgery. We discussed the importance of the preoperative diet plan. We rediscussed the typical postoperative course. Over the past 12 months she is on ongoing research and education on her own. Therefore she declined to talk about risk and benefits of Roux-en-Y gastric bypass since we have previously discussed that at her last visit. All of her questions were addressed and answered. I stressed the importance get again just before she left about the preoperative diet plan in order to help make the liver smaller to make surgery technically safer and/or easier.  Leighton Ruff. Redmond Pulling, MD, [place], Bariatric, & Minimally Invasive Surgery [facility], [place]   Gayland Curry, MD 03/29/2016, 7:10 AM

## 2016-03-29 NOTE — Op Note (Signed)
Preoperative diagnosis: Roux-en-Y gastric bypass  Postoperative diagnosis: Same   Procedure: Upper endoscopy   Surgeon: Gurney Maxin, M.D.  Anesthesia: Gen.   Indications for procedure: This patient was undergoing a Roux-en-Y gastric bypass.   Description of procedure: The endoscopy was placed in the mouth and into the oropharynx and under endoscopic vision it was advanced to the esophagogastric junction. The pouch was insufflated and no bleeding or bubbles were seen. The GEJ was identified at 42cm from the teeth. The anastomosis was seen at 48cm. No bleeding or leaks were detected. The scope was withdrawn without difficulty.   Gurney Maxin, M.D. General, Bariatric, & Minimally Invasive Surgery [facility] [place]

## 2016-03-29 NOTE — Anesthesia Procedure Notes (Signed)
Procedure Name: Intubation Date/Time: 03/29/2016 7:25 AM Performed by: Ofilia Neas Pre-anesthesia Checklist: Patient identified, Emergency Drugs available, Suction available, Patient being monitored and Timeout performed Patient Re-evaluated:Patient Re-evaluated prior to inductionOxygen Delivery Method: Circle system utilized Preoxygenation: Pre-oxygenation with 100% oxygen Intubation Type: IV induction and Cricoid Pressure applied Ventilation: Mask ventilation with difficulty Laryngoscope Size: Glidescope and 4 Grade View: Grade III Tube type: Oral Tube size: 7.5 mm Number of attempts: 2 Airway Equipment and Method: Video-laryngoscopy Placement Confirmation: ETT inserted through vocal cords under direct vision,  positive ETCO2 and breath sounds checked- equal and bilateral Secured at: 23 cm Tube secured with: Tape Dental Injury: Teeth and Oropharynx as per pre-operative assessment  Difficulty Due To: Difficulty was anticipated, Difficult Airway- due to anterior larynx, Difficult Airway- due to limited oral opening, Difficult Airway- due to large tongue and Difficult Airway- due to reduced neck mobility Future Recommendations: Recommend- induction with short-acting agent, and alternative techniques readily available Comments: DL once with regular Mac 4.  Very large tongue limited opening, unable to expose cords with cricoid press/head lift.  Masked, second attempt glidescope 4.  Cords visualized.

## 2016-03-29 NOTE — Transfer of Care (Signed)
Immediate Anesthesia Transfer of Care Note  Patient: Kathryn Washington  Procedure(s) Performed: Procedure(s): LAPAROSCOPIC ROUX-EN-Y GASTRIC BYPASS WITH UPPER ENDOSCOPY (N/A)  Patient Location: PACU  Anesthesia Type:General  Level of Consciousness: awake, oriented, patient cooperative, lethargic and responds to stimulation  Airway & Oxygen Therapy: Patient Spontanous Breathing and Patient connected to face mask oxygen  Post-op Assessment: Report given to RN, Post -op Vital signs reviewed and stable and Patient moving all extremities  Post vital signs: Reviewed and stable  Last Vitals:  Filed Vitals:   03/29/16 0529  BP: 128/76  Pulse: 99  Temp: 36.9 C  Resp: 18    Last Pain: There were no vitals filed for this visit.       Complications: No apparent anesthesia complications

## 2016-03-29 NOTE — Anesthesia Postprocedure Evaluation (Signed)
Anesthesia Post Note  Patient: Kathryn Washington  Procedure(s) Performed: Procedure(s) (LRB): LAPAROSCOPIC ROUX-EN-Y GASTRIC BYPASS WITH UPPER ENDOSCOPY (N/A)  Patient location during evaluation: PACU Anesthesia Type: General Level of consciousness: awake and alert Pain management: pain level controlled Vital Signs Assessment: post-procedure vital signs reviewed and stable Respiratory status: spontaneous breathing, nonlabored ventilation, respiratory function stable and patient connected to nasal cannula oxygen Cardiovascular status: blood pressure returned to baseline and stable Postop Assessment: no signs of nausea or vomiting Anesthetic complications: no    Last Vitals:  Filed Vitals:   03/29/16 1130 03/29/16 1145  BP: 136/72 138/72  Pulse: 78 77  Temp:  36.7 C  Resp: 17 18    Last Pain:  Filed Vitals:   03/29/16 1146  PainSc: Asleep                 Rashauna Tep S

## 2016-03-29 NOTE — Anesthesia Preprocedure Evaluation (Signed)
Anesthesia Evaluation  Patient identified by MRN, date of birth, ID band Patient awake    Reviewed: Allergy & Precautions, NPO status , Patient's Chart, lab work & pertinent test results  Airway Mallampati: II  TM Distance: <3 FB Neck ROM: Full    Dental no notable dental hx.    Pulmonary sleep apnea and Continuous Positive Airway Pressure Ventilation ,    Pulmonary exam normal breath sounds clear to auscultation       Cardiovascular hypertension, Pt. on medications negative cardio ROS Normal cardiovascular exam Rhythm:Regular Rate:Normal     Neuro/Psych negative neurological ROS  negative psych ROS   GI/Hepatic negative GI ROS, Neg liver ROS,   Endo/Other  Morbid obesity  Renal/GU negative Renal ROS  negative genitourinary   Musculoskeletal negative musculoskeletal ROS (+)   Abdominal   Peds negative pediatric ROS (+)  Hematology negative hematology ROS (+)   Anesthesia Other Findings   Reproductive/Obstetrics negative OB ROS                             Anesthesia Physical Anesthesia Plan  ASA: III  Anesthesia Plan: General   Post-op Pain Management:    Induction: Intravenous  Airway Management Planned: Oral ETT  Additional Equipment:   Intra-op Plan:   Post-operative Plan: Extubation in OR  Informed Consent: I have reviewed the patients History and Physical, chart, labs and discussed the procedure including the risks, benefits and alternatives for the proposed anesthesia with the patient or authorized representative who has indicated his/her understanding and acceptance.   Dental advisory given  Plan Discussed with: CRNA and Surgeon  Anesthesia Plan Comments:         Anesthesia Quick Evaluation

## 2016-03-29 NOTE — Op Note (Signed)
Kathryn Washington [id number] Feb 04, 1966. 03/29/2016  Preoperative diagnosis:  1. Morbid obesity (BMI 56)    Acid reflux   BP (high blood pressure)   OSA (obstructive sleep apnea)   Fatty liver   Prediabetes  Postoperative  diagnosis:  1. same  Surgical procedure: Laparoscopic Roux-en-Y gastric bypass (ante-colic, ante-gastric); upper endoscopy  Surgeon: Gayland Curry, M.D. FACS  Asst.: Gurney Maxin, MD FACS  Anesthesia: General plus 70 cc exparel  Complications: None   EBL: Minimal   Drains: None   Disposition: PACU in good condition   Indications for procedure: 50 yo WF with morbid obesity who has been unsuccessful at sustained weight loss. The patient's comorbidities are listed above. We discussed the risk and benefits of surgery including but not limited to anesthesia risk, bleeding, infection, blood clot formation, anastomotic leak, anastomotic stricture, ulcer formation, death, respiratory complications, intestinal blockage, internal hernia, gallstone formation, vitamin and nutritional deficiencies, injury to surrounding structures, failure to lose weight and mood changes.   Description of procedure: Patient is brought to the operating room and general anesthesia induced. The patient had received preoperative broad-spectrum IV antibiotics and subcutaneous heparin. The abdomen was widely sterilely prepped with Chloraprep and draped. Patient timeout was performed and correct patient and procedure confirmed. Access was obtained with a 12 mm Optiview trocar in the left upper quadrant and pneumoperitoneum established without difficulty. Under direct vision 12 mm trocars were placed laterally in the right upper quadrant, right upper quadrant midclavicular line, and to the left and above the umbilicus for the camera port. A 5 mm trocar was placed laterally in the left upper quadrant. There were some omental adhesions in the lower abdomen which were taken down with harmonic scalpel. The  omentum was brought into the upper abdomen and the transverse mesocolon elevated and the ligament of Treitz clearly identified. A 45cm biliopancreatic limb was then carefully measured from the ligament of Treitz. The small intestine was divided at this point with a single firing of the white load linear stapler. A Penrose drain was sutured to the end of the Roux-en-Y limb for later identification. A 100 cm Roux-en-Y limb was then carefully measured. At this point a side-to-side anastomosis was created between the Roux limb and the end of the biliopancreatic limb. This was accomplished with a single firing of the 45 mm white load linear stapler. The common enterotomy was closed with a running 2-0 Vicryl begun at either end of the enterotomy and tied centrally. Tisseel tissue sealant was placed over the anastomosis. The mesenteric defect was then closed with running 2-0 silk. The omentum was then divided with the harmonic scalpel up towards the transverse colon to allow mobility of the Roux limb toward the gastric pouch. The patient was then placed in steep reversed Trendelenburg. Through a 5 mm subxiphoid site the [facility] retractor was placed and the left lobe of the liver elevated with excellent exposure of the upper stomach and hiatus. The angle of Hiss was then mobilized with the harmonic scalpel. A 5 -6  cm gastric pouch was then carefully measured along the lesser curve of the stomach. Dissection was carried along the lesser curve at this point with the Harmonic scalpel working carefully back toward the lesser sac at right angles to the lesser curve. The free lesser sac was then entered. After being sure all tubes were removed from the stomach an initial firing of the gold load 60 mm linear stapler was fired at right angles across the lesser curve for about  4 cm. The gastric pouch was further mobilized posteriorly and then the pouch was completed with 4 further firings of the 60 mm blue load linear stapler  up  through the previously dissected angle of His. It was ensured that the pouch was completely mobilized away from the gastric remnant. This created a nice tubular 4-5 cm gastric pouch. The Roux limb was then brought up in an antecolic fashion with the candycane facing to the patient's left without undue tension. The candy cane was short. The gastrojejunostomy was created with an initial posterior row of 2-0 Vicryl between the Roux limb and the staple line of the gastric pouch. Enterotomies were then made in the gastric pouch and the Roux limb with the harmonic scalpel and at approximately 2-2-1/2 cm anastomosis was created with a single firing of the 69mm blue load linear stapler. The staple line was inspected and was intact without bleeding. The common enterotomy was then closed with running 2-0 Vicryl begun at either end and tied centrally. The Ewall tube was then easily passed through the anastomosis and an outer anterior layer of running 2-0 Vicryl was placed. The Ewald tube was removed. With the outlet of the gastrojejunostomy clamped and under saline irrigation the assistant performed upper endoscopy and with the gastric pouch tensely distended with air-there was no evidence of leak on this test. The pouch was desufflated. The Terance Hart defect was closed with running 2-0 silk. The abdomen was inspected for any evidence of bleeding or bowel injury and everything looked fine. The Nathanson retractor was removed under direct vision after coating the anastomosis with Tisseel tissue sealant. Exparel was then infiltrated in and around all trocar sites using laparoscopic guidance. All CO2 was evacuated and trochars removed. Skin incisions were closed with 4-0 monocryl in a subcuticular fashion followed by Dermabond. Sponge needle and instrument counts were correct. The patient was taken to the PACU in good condition.    Kathryn Washington. Redmond Pulling, MD, FACS General, Bariatric, & Minimally Invasive Surgery [facility]  Surgery, [place]

## 2016-03-29 NOTE — Progress Notes (Signed)
Pt has CPAP machine from home and wishes to utilize tonight.  RT inspected for frayed or damaged wires, none were present.  Pt's home machine is working properly at this time.  Pt to self administer when ready for bed, RT to monitor and assess as needed.

## 2016-03-30 ENCOUNTER — Ambulatory Visit: Payer: BLUE CROSS/BLUE SHIELD

## 2016-03-30 LAB — CBC WITH DIFFERENTIAL/PLATELET
BASOS ABS: 0 10*3/uL (ref 0.0–0.1)
Basophils Relative: 0 %
Eosinophils Absolute: 0 10*3/uL (ref 0.0–0.7)
Eosinophils Relative: 0 %
HEMATOCRIT: 43.4 % (ref 36.0–46.0)
Hemoglobin: 14.5 g/dL (ref 12.0–15.0)
LYMPHS ABS: 1.3 10*3/uL (ref 0.7–4.0)
LYMPHS PCT: 8 %
MCH: 28.5 pg (ref 26.0–34.0)
MCHC: 33.4 g/dL (ref 30.0–36.0)
MCV: 85.4 fL (ref 78.0–100.0)
MONO ABS: 1.2 10*3/uL — AB (ref 0.1–1.0)
MONOS PCT: 7 %
NEUTROS ABS: 13.7 10*3/uL — AB (ref 1.7–7.7)
Neutrophils Relative %: 85 %
Platelets: 340 10*3/uL (ref 150–400)
RBC: 5.08 MIL/uL (ref 3.87–5.11)
RDW: 13.6 % (ref 11.5–15.5)
WBC: 16.2 10*3/uL — ABNORMAL HIGH (ref 4.0–10.5)

## 2016-03-30 LAB — COMPREHENSIVE METABOLIC PANEL
ALT: 92 U/L — ABNORMAL HIGH (ref 14–54)
AST: 103 U/L — AB (ref 15–41)
Albumin: 4.2 g/dL (ref 3.5–5.0)
Alkaline Phosphatase: 74 U/L (ref 38–126)
Anion gap: 7 (ref 5–15)
BILIRUBIN TOTAL: 0.8 mg/dL (ref 0.3–1.2)
BUN: 10 mg/dL (ref 6–20)
CO2: 28 mmol/L (ref 22–32)
CREATININE: 0.7 mg/dL (ref 0.44–1.00)
Calcium: 9.1 mg/dL (ref 8.9–10.3)
Chloride: 102 mmol/L (ref 101–111)
Glucose, Bld: 155 mg/dL — ABNORMAL HIGH (ref 65–99)
POTASSIUM: 4 mmol/L (ref 3.5–5.1)
Sodium: 137 mmol/L (ref 135–145)
TOTAL PROTEIN: 6.8 g/dL (ref 6.5–8.1)

## 2016-03-30 LAB — HEMOGLOBIN AND HEMATOCRIT, BLOOD
HCT: 44.9 % (ref 36.0–46.0)
HEMOGLOBIN: 14.7 g/dL (ref 12.0–15.0)

## 2016-03-30 MED ORDER — ALUM & MAG HYDROXIDE-SIMETH 200-200-20 MG/5ML PO SUSP
15.0000 mL | Freq: Four times a day (QID) | ORAL | Status: DC | PRN
  Administered 2016-03-30 – 2016-03-31 (×3): 15 mL via ORAL
  Filled 2016-03-30 (×3): qty 30

## 2016-03-30 NOTE — Plan of Care (Signed)
Problem: Food- and Nutrition-Related Knowledge Deficit (NB-1.1) Goal: Nutrition education Formal process to instruct or train a patient/client in a skill or to impart knowledge to help patients/clients voluntarily manage or modify food choices and eating behavior to maintain or improve health. Outcome: Completed/Met Date Met:  03/30/16 Nutrition Education Note  Received consult for diet education per DROP protocol.   Discussed 2 week post op diet with pt. Emphasized that liquids must be non carbonated, non caffeinated, and sugar free. Fluid goals discussed. Reviewed progression of diet to include soft proteins at 7-10 days post-op. Pt to follow up with outpatient bariatric RD for further diet progression after 2 weeks. Multivitamins and minerals also reviewed. Teach back method used, pt expressed understanding, expect good compliance.   Diet: First 2 Weeks  You will see the dietitian about two (2) weeks after your surgery. The dietitian will increase the types of foods you can eat if you are handling liquids well:  If you have severe vomiting or nausea and cannot handle clear liquids lasting longer than 1 day, call your surgeon  Protein Shake  Drink at least 2 ounces of shake 5-6 times per day  Each serving of protein shakes (usually 8 - 12 ounces) should have a minimum of:  15 grams of protein  And no more than 5 grams of carbohydrate  Goal for protein each day:  Men = 80 grams per day  Women = 60 grams per day  Protein powder may be added to fluids such as non-fat milk or Lactaid milk or Soy milk (limit to 35 grams added protein powder per serving)   Hydration  Slowly increase the amount of water and other clear liquids as tolerated (See Acceptable Fluids)  Slowly increase the amount of protein shake as tolerated  Sip fluids slowly and throughout the day  May use sugar substitutes in small amounts (no more than 6 - 8 packets per day; i.e. Splenda)   Fluid Goal  The first goal is to  drink at least 8 ounces of protein shake/drink per day (or as directed by the nutritionist); some examples of protein shakes are Syntrax Nectar, Adkins Advantage, EAS Edge HP, and Unjury. See handout from pre-op Bariatric Education Class:  Slowly increase the amount of protein shake you drink as tolerated  You may find it easier to slowly sip shakes throughout the day  It is important to get your proteins in first  Your fluid goal is to drink 64 - 100 ounces of fluid daily  It may take a few weeks to build up to this  32 oz (or more) should be clear liquids  And  32 oz (or more) should be full liquids (see below for examples)  Liquids should not contain sugar, caffeine, or carbonation   Clear Liquids:  Water or Sugar-free flavored water (i.e. Fruit H2O, Propel)  Decaffeinated coffee or tea (sugar-free)  Crystal Lite, Wyler's Lite, Minute Maid Lite  Sugar-free Jell-O  Bouillon or broth  Sugar-free Popsicle: *Less than 20 calories each; Limit 1 per day   Full Liquids:  Protein Shakes/Drinks + 2 choices per day of other full liquids  Full liquids must be:  No More Than 12 grams of Carbs per serving  No More Than 3 grams of Fat per serving  Strained low-fat cream soup  Non-Fat milk  Fat-free Lactaid Milk  Sugar-free yogurt (Dannon Lite & Fit, Greek yogurt)     Kathryn Schreier, MS, RD, LDN Pager: [phone] After Hours Pager: [phone]        

## 2016-03-30 NOTE — Progress Notes (Signed)
Patient alert and oriented, Post op day 1.  Provided support and encouragement.  Encouraged pulmonary toilet, ambulation and small sips of liquids.  All questions answered.  Will continue to monitor. 

## 2016-03-30 NOTE — Progress Notes (Signed)
RT setup Pt's home CPAP machine for the night.  Pt states that she will self administer when ready for bed.  RT to monitor and assess as needed.

## 2016-03-31 LAB — CBC WITH DIFFERENTIAL/PLATELET
BASOS ABS: 0.1 10*3/uL (ref 0.0–0.1)
Basophils Relative: 0 %
EOS ABS: 0.2 10*3/uL (ref 0.0–0.7)
EOS PCT: 2 %
HCT: 40.6 % (ref 36.0–46.0)
Hemoglobin: 13.5 g/dL (ref 12.0–15.0)
LYMPHS PCT: 29 %
Lymphs Abs: 3.7 10*3/uL (ref 0.7–4.0)
MCH: 28.7 pg (ref 26.0–34.0)
MCHC: 33.3 g/dL (ref 30.0–36.0)
MCV: 86.4 fL (ref 78.0–100.0)
MONO ABS: 1.2 10*3/uL — AB (ref 0.1–1.0)
Monocytes Relative: 9 %
Neutro Abs: 7.9 10*3/uL — ABNORMAL HIGH (ref 1.7–7.7)
Neutrophils Relative %: 60 %
PLATELETS: 287 10*3/uL (ref 150–400)
RBC: 4.7 MIL/uL (ref 3.87–5.11)
RDW: 14.2 % (ref 11.5–15.5)
WBC: 13.1 10*3/uL — AB (ref 4.0–10.5)

## 2016-03-31 MED ORDER — OXYCODONE HCL 5 MG/5ML PO SOLN: 5.0000 mg | ORAL | Status: DC | PRN

## 2016-03-31 NOTE — Progress Notes (Signed)
Patient alert and oriented, pain is controlled. Patient is tolerating fluids, advanced to protein shake yesterday,  patient is tolerating well.  Reviewed Gastric sleeve discharge instructions with patient and patient is able to articulate understanding.  Provided information on BELT program, Support Group and [facility] outpatient pharmacy. All questions answered, will continue to monitor.

## 2016-03-31 NOTE — Discharge Instructions (Signed)

## 2016-03-31 NOTE — Progress Notes (Signed)
2 Days Post-Op  Subjective: Bed not comfortable. Drank 1/2 chicken soup shake yesterday afternoon. Ambulated. Some epigastric discomfort  Objective: Vital signs in last 24 hours: Temp:  [98.3 F (36.8 C)-98.8 F (37.1 C)] 98.8 F (37.1 C) (07/12 0540) Pulse Rate:  [67-80] 80 (07/12 0540) Resp:  [17-20] 18 (07/12 0540) BP: (114-142)/(64-98) 134/69 mmHg (07/12 0540) SpO2:  [96 %-99 %] 96 % (07/12 0540) Weight:  [168.3 kg (371 lb 0.6 oz)] 168.3 kg (371 lb 0.6 oz) (07/12 0540) Last BM Date: 03/28/16  Intake/Output from previous day: 07/11 0701 - 07/12 0700 In: 3300 [P.O.:300; I.V.:3000] Out: 501 [Urine:501] Intake/Output this shift:    Asleep, easily arousable cta b/l Reg Soft, obese, incisions c/d/i; fairly nontender +SCDs, no edema  Lab Results:   Recent Labs  03/30/16 0341 03/30/16 1547 03/31/16 0442  WBC 16.2*  --  13.1*  HGB 14.5 14.7 13.5  HCT 43.4 44.9 40.6  PLT 340  --  287   BMET  Recent Labs  03/30/16 0341  NA 137  K 4.0  CL 102  CO2 28  GLUCOSE 155*  BUN 10  CREATININE 0.70  CALCIUM 9.1   PT/INR No results for input(s): LABPROT, INR in the last 72 hours. ABG No results for input(s): PHART, HCO3 in the last 72 hours.  Invalid input(s): PCO2, PO2  Studies/Results: No results found.  Anti-infectives: Anti-infectives    Start     Dose/Rate Route Frequency Ordered Stop   03/29/16 0535  cefoTEtan in Dextrose 5% (CEFOTAN) IVPB 2 g     2 g Intravenous On call to O.R. 03/29/16 0535 03/29/16 0715      Assessment/Plan: s/p Procedure(s): LAPAROSCOPIC ROUX-EN-Y GASTRIC BYPASS WITH UPPER ENDOSCOPY (N/A)  Vitals ok. No fever. No tachycardia If takes adequate po this am, will discharge.  Discussed dc plans.   Leighton Ruff. Redmond Pulling, MD, FACS General, Bariatric, & Minimally Invasive Surgery [facility], [place]     LOS: 2 days    Gayland Curry 03/31/2016

## 2016-04-01 NOTE — Discharge Summary (Signed)
Physician Discharge Summary  Kathryn Washington [id number] DOB: March 03, 1966 DOA: 03/29/2016  PCP: Thurman Coyer, MD  Admit date: 03/29/2016 Discharge date: 03/31/2016  Recommendations for Outpatient Follow-up:   Follow-up Information    Follow up with Gayland Curry, MD. Go on 04/14/2016.   Specialty:  General Surgery   Why:  For Post-Op Check at 8:45   Contact information:   [address] [place] [address] [phone]       Follow up with Gayland Curry, MD. Go on 05/12/2016.   Specialty:  General Surgery   Why:  For Post-Op Check at 8:45   Contact information:   [place] [address] [phone]      Discharge Diagnoses:  Principal Problem:   Morbid obesity ([place]) Active Problems:   Acid reflux   BP (high blood pressure)   OSA (obstructive sleep apnea)   Fatty liver   Prediabetes   S/P gastric bypass   Surgical Procedure: Laparoscopic Roux-en-Y gastric bypass, upper endoscopy  Discharge Condition: Good Disposition: Home  Diet recommendation: Postoperative gastric bypass diet  Filed Weights   03/29/16 0536 03/30/16 0510 03/31/16 0540  Weight: 165.79 kg (365 lb 8 oz) 166.1 kg (366 lb 2.9 oz) 168.3 kg (371 lb 0.6 oz)     Hospital Course:  The patient was admitted for a planned laparoscopic Roux-en-Y gastric bypass. Please see operative note. Preoperatively the patient was given 5000 units of subcutaneous heparin for DVT prophylaxis. Her intraoperative leak test was negative.  Postoperative prophylactic Lovenox dosing was started on the morning of postoperative day 1. In the evening on POD 0 she was started on ice chips. On POD 1 she remained without fever or tachycardia so she was advanced to water and later protein shakes.  The patient was started on ice chips and water which they tolerated. However her oral intake was not safe for discharge. On postoperative day 2 her oral intake improved.  The patient was ambulating without  difficulty. Their vital signs are stable without fever or tachycardia. Their hemoglobin had remained stable. The patient was maintained on their home settings for CPAP therapy. The patient had received discharge instructions and counseling. They were deemed stable for discharge.   Discharge Instructions  Discharge Instructions    Ambulate hourly while awake    Complete by:  As directed      Call MD for:  difficulty breathing, headache or visual disturbances    Complete by:  As directed      Call MD for:  persistant dizziness or light-headedness    Complete by:  As directed      Call MD for:  persistant nausea and vomiting    Complete by:  As directed      Call MD for:  redness, tenderness, or signs of infection (pain, swelling, redness, odor or green/yellow discharge around incision site)    Complete by:  As directed      Call MD for:  severe uncontrolled pain    Complete by:  As directed      Call MD for:  temperature >101 F    Complete by:  As directed      Diet bariatric full liquid    Complete by:  As directed      Discharge instructions    Complete by:  As directed   See bariatric discharge instructions     Incentive spirometry    Complete by:  As directed   Perform hourly while  awake            Medication List    STOP taking these medications        IBUPROFEN COLD & SINUS PO     naproxen sodium 220 MG tablet  Commonly known as:  ANAPROX      TAKE these medications        chlorpheniramine 4 MG tablet  Commonly known as:  CHLOR-TRIMETON  Take 4 mg by mouth 2 (two) times daily as needed for allergies.     cyclobenzaprine 5 MG tablet  Commonly known as:  FLEXERIL  Take 5 mg by mouth 3 (three) times daily as needed for muscle spasms.     fluticasone 50 MCG/ACT nasal spray  Commonly known as:  FLONASE  Place 2 sprays into both nostrils daily.     lisinopril-hydrochlorothiazide 10-12.5 MG tablet  Commonly known as:  PRINZIDE,ZESTORETIC  Take 1 tablet by mouth  daily.  Notes to Patient:  Monitor Blood Pressure Daily and keep a log for primary care physician.  Monitor for symptoms of dehydration.  You may need to make changes to your medications with rapid weight loss.        methocarbamol 500 MG tablet  Commonly known as:  ROBAXIN  Take 1 tablet by mouth 2 (two) times daily.     oxyCODONE 5 MG/5ML solution  Commonly known as:  ROXICODONE  Take 5-10 mLs (5-10 mg total) by mouth every 4 (four) hours as needed for moderate pain or severe pain.     RABEprazole 20 MG tablet  Commonly known as:  ACIPHEX  Take 20 mg by mouth daily.     SUMAtriptan 50 MG tablet  Commonly known as:  IMITREX  Take 1 tablet by mouth every 2 (two) hours as needed for migraine.     Vitamin D3 5000 units Caps  Take 2 capsules by mouth daily.           Follow-up Information    Follow up with Gayland Curry, MD. Go on 04/14/2016.   Specialty:  General Surgery   Why:  For Post-Op Check at 8:45   Contact information:   [address] [place] [address] [phone]       Follow up with Gayland Curry, MD. Go on 05/12/2016.   Specialty:  General Surgery   Why:  For Post-Op Check at 8:45   Contact information:   [place] [address] [phone]        The results of significant diagnostics from this hospitalization (including imaging, microbiology, ancillary and laboratory) are listed below for reference.    Significant Diagnostic Studies: No results found.  Labs: Basic Metabolic Panel:  Recent Labs Lab 03/25/16 1500 03/30/16 0341  NA 139 137  K 3.8 4.0  CL 102 102  CO2 29 28  GLUCOSE 90 155*  BUN 17 10  CREATININE 0.69 0.70  CALCIUM 9.8 9.1   Liver Function Tests:  Recent Labs Lab 03/25/16 1500 03/30/16 0341  AST 19 103*  ALT 20 92*  ALKPHOS 80 74  BILITOT 0.5 0.8  PROT 7.6 6.8  ALBUMIN 4.5 4.2    CBC:  Recent Labs Lab 03/25/16 1500 03/29/16 1055 03/30/16 0341 03/30/16 1547  03/31/16 0442  WBC 10.4  --  16.2*  --  13.1*  NEUTROABS 6.3  --  13.7*  --  7.9*  HGB 14.8 14.8 14.5 14.7 13.5  HCT 42.7 44.3 43.4 44.9 40.6  MCV  83.6  --  85.4  --  86.4  PLT 325  --  340  --  287    CBG: No results for input(s): GLUCAP in the last 168 hours.  Principal Problem:   Morbid obesity ([place]) Active Problems:   Acid reflux   BP (high blood pressure)   OSA (obstructive sleep apnea)   Fatty liver   Prediabetes   S/P gastric bypass   Time coordinating discharge: 15 minutes  Signed:  Gayland Curry, MD [facility], [place] [phone] 04/01/2016, 9:05 AM

## 2016-04-07 ENCOUNTER — Telehealth (HOSPITAL_COMMUNITY): Payer: Self-pay

## 2016-04-07 NOTE — Telephone Encounter (Signed)

## 2016-04-07 NOTE — Telephone Encounter (Deleted)
Attempted DROP discharge call, no answer, left message to return call  Made discharge phone call to patient per DROP protocol. Asking the following questions.    1. Do you have someone to care for you now that you are home?   2. Are you having pain now that is not relieved by your pain medication?   3. Are you able to drink the recommended daily amount of fluids (48 ounces minimum/day) and protein (60-80 grams/day) as prescribed by the dietitian or nutritional counselor?   4. Are you taking the vitamins and minerals as prescribed?   5. Do you have the "on call" number to contact your surgeon if you have a problem or question?   6. Are your incisions free of redness, swelling or drainage? (If steri strips, address that these can fall off, shower as tolerated)  7. Have your bowels moved since your surgery?  If not, are you passing gas?   8. Are you up and walking 3-4 times per day?

## 2016-04-13 ENCOUNTER — Encounter: Payer: BLUE CROSS/BLUE SHIELD | Attending: General Surgery

## 2016-04-13 DIAGNOSIS — Z6841 Body Mass Index (BMI) 40.0 and over, adult: Secondary | ICD-10-CM | POA: Insufficient documentation

## 2016-04-13 DIAGNOSIS — Z713 Dietary counseling and surveillance: Secondary | ICD-10-CM | POA: Diagnosis not present

## 2016-04-13 NOTE — Progress Notes (Signed)
Bariatric Class:  Appt start time: 1530 end time:  1630.  2 Week Post-Operative Nutrition Class  Patient was seen on 04/13/2016 for Post[facility]ative [facility]   Surgery date: 03/29/2016 Surgery type: RYGB Start weight at [facility]: 382 lbs on 12/03/2015 Weight today: 341.0  lbs  Weight change: 35.6 lbs  TANITA  BODY COMP RESULTS  02/23/16 04/13/16   BMI (kg/m^2) 57.3 51.8   Fat Mass (lbs) 224.4 204.6   Fat Free Mass (lbs) 152.2 136.4   Total Body Water (lbs) N/A N/A   The following the learning objectives were met by the patient during this course:  Identifies Phase 3A (Soft, High Proteins) Dietary Goals and will begin from 2 weeks post-operatively to 2 months post-operatively  Identifies appropriate sources of fluids and proteins   States protein recommendations and appropriate sources post-operatively  Identifies the need for appropriate texture modifications, mastication, and bite sizes when consuming solids  Identifies appropriate multivitamin and calcium sources post-operatively  Describes the need for physical activity post-operatively and will follow MD recommendations  States when to call healthcare provider regarding medication questions or post-operative complications  Handouts given during class include:  Phase 3A: Soft, High Protein Diet Handout  Follow-Up Plan: Patient will follow-up at [facility] in 6 weeks for 2 month post-op nutrition visit for diet advancement per MD.

## 2016-05-19 ENCOUNTER — Ambulatory Visit (INDEPENDENT_AMBULATORY_CARE_PROVIDER_SITE_OTHER): Payer: BLUE CROSS/BLUE SHIELD | Admitting: Pulmonary Disease

## 2016-05-19 ENCOUNTER — Encounter: Payer: Self-pay | Admitting: Pulmonary Disease

## 2016-05-19 DIAGNOSIS — G4733 Obstructive sleep apnea (adult) (pediatric): Secondary | ICD-10-CM | POA: Diagnosis not present

## 2016-05-19 NOTE — Assessment & Plan Note (Addendum)
Patient had a sleep study done at [facility], December, 2012. AHI 57. Patient has snoring despite CPAP. She was optimal at CPAP of 7 cm water. AHI was 0. Has issues with CPAP mask with too much pressure. Has hypersomnia, snoring fatigue.  Patient had a repeat home sleep study in May, 2017. AHI was 31. On auto CPAP, 5-15 cm water.   Patient had gastric bypass July, 2017.  Currently, uses CPAP. Feels better using it. More energy. Less sleepiness. Feels benefit of CPAP. Download the last month (August, 2017): 90%, AHI 1.2.  Plan: We extensively discussed the importance of treating OSA and the need to use PAP therapy.   Continue with autocpap 5-15 cm water. She is tolerating pressure. Usually runs around 10-12 centimeters water based on download. If she continues to lose weight and if she feels the pressure is too much, we'll decrease her range accordingly.   Patient was instructed to have mask, tubings, filter, reservoir cleaned at least once a week with soapy water.  Patient was instructed to call the office if he/she is having issues with the PAP device.    I advised patient to obtain sufficient amount of sleep --  7 to 8 hours at least in a 24 hr period.  Patient was advised to follow good sleep hygiene.  Patient was advised NOT to engage in activities requiring concentration and/or vigilance if he/she is and  sleepy.  Patient is NOT to drive if he/she is sleepy.

## 2016-05-19 NOTE — Progress Notes (Signed)
Subjective:    Patient ID: Kathryn Washington, female    DOB: 04/10/1966, 50 y.o.   MRN: [id number]  HPI   This is the case of Kathryn Washington, 50 y.o. Female, who was referred by Dr. Thea Silversmith and Dr. Redmond Pulling in consultation regarding OSA.   As you very well know, patient denies smoking. No history of lung dse.  Has hyeprsomnia, snoring, gasping, witnessed apneas. Hypersomnia affect fxnality.  ESS 12  Pt is seeing Dr. Redmond Pulling from [place] for bariatric surgery. No schedule yet. Cleared by Cardiology.  PSG done in 2012 with severe osa. Never been on CPAP.  ROV 8/301/7 Since last seen, she had her gastric bypass on March 29, 2016 at [facility]. No issues with surgery. She has lost 70 lbs over all. Feels better using cpap. More energy.  Less sleepiness. Feels benefit of CPAP.     Review of Systems  Constitutional: Negative.  Negative for fever and unexpected weight change.       Gained 30 lbs x 5 yrs.   HENT: Negative for congestion, dental problem, ear pain, nosebleeds, postnasal drip, rhinorrhea, sinus pressure, sneezing, sore throat and trouble swallowing.   Eyes: Negative for redness and itching.  Respiratory: Positive for shortness of breath. Negative for cough, chest tightness and wheezing.   Cardiovascular: Negative.  Negative for palpitations and leg swelling.  Gastrointestinal: Negative.  Negative for nausea and vomiting.  Endocrine: Negative.   Genitourinary: Negative.  Negative for dysuria.  Musculoskeletal: Positive for arthralgias and joint swelling.  Skin: Negative.  Negative for rash.  Allergic/Immunologic: Negative.   Neurological: Positive for headaches.  Hematological: Negative.  Does not bruise/bleed easily.  Psychiatric/Behavioral: Negative.  Negative for dysphoric mood. The patient is not nervous/anxious.   All other systems reviewed and are negative.         Objective:   Physical Exam   Vitals:  Vitals:   05/19/16 0858  BP: 118/68  Pulse: 82  SpO2: 97%  Weight:  (!) 324 lb (147 kg)  Height: 5\' 8"  (1.727 m)    Constitutional/General:  Pleasant, well-nourished, well-developed, not in any distress,  Comfortably seating.  Well kempt  morbidlly obese  Body mass index is 49.26 kg/m. Wt Readings from Last 3 Encounters:  05/19/16 (!) 324 lb (147 kg)  04/13/16 (!) 341 lb (154.7 kg)  03/31/16 (!) 371 lb 0.6 oz (168.3 kg)    Neck circumference: 19 inches  HEENT: Pupils equal and reactive to light and accommodation. Anicteric sclerae. Normal nasal mucosa.   No oral  lesions,  mouth clear,  oropharynx clear, no postnasal drip. (-) Oral thrush. No dental caries.  Airway - Mallampati class IV  Neck: No masses. Midline trachea. No JVD, (-) LAD. (-) bruits appreciated.  Respiratory/Chest: Grossly normal chest. (-) deformity. (-) Accessory muscle use.  Symmetric expansion. (-) Tenderness on palpation.  Resonant on percussion.  Diminished BS on both lower lung zones. (-) wheezing, crackles, rhonchi (-) egophony  Cardiovascular: Regular rate and  rhythm, heart sounds normal, no murmur or gallops, no peripheral edema  Gastrointestinal:  Normal bowel sounds. Soft, non-tender. No hepatosplenomegaly.  (-) masses.   Musculoskeletal:  Normal muscle tone. Normal gait.   Extremities: Grossly normal. (-) clubbing, cyanosis.  (-) edema  Skin: (-) rash,lesions seen.   Neurological/Psychiatric : alert, oriented to time, place, person. Normal mood and affect           Assessment & Plan:  OSA (obstructive sleep apnea) Patient had a sleep study done at [facility], December, 2012. AHI 57. Patient has snoring despite CPAP. She was optimal at CPAP of 7 cm water. AHI was 0. Has issues with CPAP mask with too much pressure. Has hypersomnia, snoring fatigue.  Patient had a repeat home sleep study in May, 2017. AHI was 31. On auto CPAP, 5-15 cm water.   Patient had gastric bypass July, 2017.  Currently, uses CPAP. Feels better using it.  More energy. Less sleepiness. Feels benefit of CPAP. Download the last month (August, 2017): 90%, AHI 1.2.  Plan: We extensively discussed the importance of treating OSA and the need to use PAP therapy.   Continue with autocpap 5-15 cm water. She is tolerating pressure. Usually runs around 10-12 centimeters water based on download. If she continues to lose weight and if she feels the pressure is too much, we'll decrease her range accordingly.   Patient was instructed to have mask, tubings, filter, reservoir cleaned at least once a week with soapy water.  Patient was instructed to call the office if he/she is having issues with the PAP device.    I advised patient to obtain sufficient amount of sleep --  7 to 8 hours at least in a 24 hr period.  Patient was advised to follow good sleep hygiene.  Patient was advised NOT to engage in activities requiring concentration and/or vigilance if he/she is and  sleepy.  Patient is NOT to drive if he/she is sleepy.    Morbid obesity ([place]) S/P gastric bypass (july 2017). Weight reduction. Lost 70 lbs since last seen.      Patient will follow up with me in 1 year.     Monica Becton, MD 05/19/2016   9:28 AM [facility] [place] Pager: [phone] Office: [phone], Fax: [phone]

## 2016-05-19 NOTE — Patient Instructions (Signed)
  It was a pleasure taking care of you today!  Continue using your CPAP machine.   Please make sure you use your CPAP device everytime you sleep.  We will monitor the usage of your machine per your insurance requirement.  Your insurance company may take the machine from you if you are not using it regularly.   Please clean the mask, tubings, filter, water reservoir with soapy water every week.  Please use distilled water for the water reservoir.   Please call the office or your machine provider (DME company) if you are having issues with the device.   Return to clinic in 1 year    

## 2016-05-19 NOTE — Assessment & Plan Note (Signed)
S/P gastric bypass (july 2017). Weight reduction. Lost 70 lbs since last seen.

## 2016-05-25 ENCOUNTER — Encounter: Payer: BLUE CROSS/BLUE SHIELD | Attending: General Surgery | Admitting: Dietician

## 2016-05-25 DIAGNOSIS — Z713 Dietary counseling and surveillance: Secondary | ICD-10-CM | POA: Diagnosis not present

## 2016-05-25 DIAGNOSIS — Z6841 Body Mass Index (BMI) 40.0 and over, adult: Secondary | ICD-10-CM | POA: Insufficient documentation

## 2016-05-25 NOTE — Patient Instructions (Signed)
Goals:  Follow Phase 3B: High Protein + Non-Starchy Vegetables  Eat 3-6 small meals/snacks, every 3-5 hrs  Increase lean protein foods to meet 60g goal  Increase fluid intake to 64oz +  Avoid drinking 15 minutes before, during and 30 minutes after eating  Aim for >30 min of physical activity daily  Try Dannon Light and Fit yogurt  Try Baritastic App  700-900 calories  78-100 grams carbohydrates  60-80 grams protein  19-25 grams fat   Surgery date: 03/29/2016 Surgery type: RYGB Start weight at [facility]: 382 lbs on 12/03/2015 Weight today: 319.6  lbs   Weight change: 21.4 lbs Total weight loss: 62.4 lbs  TANITA  BODY COMP RESULTS  02/23/16 04/13/16 05/25/16   BMI (kg/m^2) 57.3 51.8 48.6   Fat Mass (lbs) 224.4 204.6 184.4   Fat Free Mass (lbs) 152.2 136.4 135.2   Total Body Water (lbs) N/A N/A 101.6

## 2016-05-25 NOTE — Progress Notes (Signed)
  Follow-up visit:  8 Weeks Post-Operative RYGB Surgery  Medical Nutrition Therapy:  Appt start time: 0800 end time:  0840.  Primary concerns today: Post-operative Bariatric Surgery Nutrition Management. Returns with a 21.4 lbs weight loss in past 6 weeks. Having trouble taking calcium and wants to take the calcium petites.   Has had some trouble tolerating chicken skewers from [place] and felt like it "bottomed out" in her stomach. Some chicken is ok. Feels like she is chewing well. Trying to not eat a whole lot at one time. Hunger level varies but overall not very hungry.  Wants to start BELT program and talked to doctor about which exercises would be ok.   Surgery date: 03/29/2016 Surgery type: RYGB Start weight at [facility]: 382 lbs on 12/03/2015 Weight today: 319.6  lbs   Weight change: 21.4 lbs Total weight loss: 62.4 lbs  TANITA  BODY COMP RESULTS  02/23/16 04/13/16 05/25/16   BMI (kg/m^2) 57.3 51.8 48.6   Fat Mass (lbs) 224.4 204.6 184.4   Fat Free Mass (lbs) 152.2 136.4 135.2   Total Body Water (lbs) N/A N/A 101.6   Preferred Learning Style:   No preference indicated   Learning Readiness:   Ready  24-hr recall: B (AM): Premier shake (30 g) Snk (AM): none  L (PM): protein shake or 1-2 slices of [place] (0000000 g) Snk (PM): none  D (PM): 1/4 cup to 1/2 cup [place] chili or taco filling with low fat cheese or 1-2 oz chicken (7-10 g) Snk (PM): none  Has 1.5 - 2 protein shakes per day  Fluid intake: 68 oz of more water with lemon, 11-22 oz protein shake Estimated total protein intake: 44-67 g   Medications: none Supplementation: going to start calcium petite and taking bariatric advantage capsules and vitamin B12 and started taking biotin  Using straws: No Drinking while eating: No - caught herself before Hair loss: yes  Carbonated beverages: No N/V/D/C: had constipation and sometimes taking Miralax Dumping syndrome: No  Recent physical activity:  Walking some, planning  to start BELT program   Progress Towards Goal(s):  In progress.  Handouts given during visit include:  Phase 3B High Protein and Non Starchy Vegetables   Nutritional Diagnosis:  [place]-3.3 Overweight/obesity related to past poor dietary habits and physical inactivity as evidenced by patient w/ recent RYGB surgery following dietary guidelines for continued weight loss.   Intervention:  Nutrition education/diet advancement. Goals:  Follow Phase 3B: High Protein + Non-Starchy Vegetables  Eat 3-6 small meals/snacks, every 3-5 hrs  Increase lean protein foods to meet 60g goal  Increase fluid intake to 64oz +  Avoid drinking 15 minutes before, during and 30 minutes after eating  Aim for >30 min of physical activity daily  Try Dannon Light and Fit yogurt  Try Baritastic App  700-900 calories  78-100 grams carbohydrates  60-80 grams protein  19-25 grams fat   Teaching Method Utilized:  Visual Auditory Hands on  Barriers to learning/adherence to lifestyle change: none  Demonstrated degree of understanding via:  Teach Back   Monitoring/Evaluation:  Dietary intake, exercise, and body weight. Follow up in 6 weeks for 3.5 month post-op visit.

## 2016-06-11 ENCOUNTER — Encounter: Payer: Self-pay | Admitting: Pulmonary Disease

## 2016-07-06 ENCOUNTER — Ambulatory Visit: Payer: BLUE CROSS/BLUE SHIELD | Admitting: Dietician

## 2016-07-20 ENCOUNTER — Encounter: Payer: BLUE CROSS/BLUE SHIELD | Attending: General Surgery | Admitting: Dietician

## 2016-07-20 DIAGNOSIS — Z713 Dietary counseling and surveillance: Secondary | ICD-10-CM | POA: Insufficient documentation

## 2016-07-20 DIAGNOSIS — Z6841 Body Mass Index (BMI) 40.0 and over, adult: Secondary | ICD-10-CM | POA: Diagnosis not present

## 2016-07-20 NOTE — Progress Notes (Signed)
  Follow-up visit: 3.5 Months Post-Operative RYGB Surgery  Medical Nutrition Therapy:  Appt start time: 0815 end time:  835  Primary concerns today: Post-operative Bariatric Surgery Nutrition Management. Returns with a 24.2 lbs weight loss in past 6 weeks.  Still using MyFitness pal sometimes. Feels like she might not always get in enough protein.   Hasn't been able to to go the the BELT program for past 3 weeks (vacation and work travel).   Felt bad after eating too quickly.    Taking calcium petites which is working better.   Surgery date: 03/29/2016 Surgery type: RYGB Start weight at [facility]: 382 lbs on 12/03/2015 Weight today: 295.4 lbs   Weight change: 24.2 lbs Total weight loss: 86.6 lbs  TANITA  BODY COMP RESULTS  02/23/16 04/13/16 05/25/16 07/20/16   BMI (kg/m^2) 57.3 51.8 48.6 44.9   Fat Mass (lbs) 224.4 204.6 184.4 163.6   Fat Free Mass (lbs) 152.2 136.4 135.2 131.8   Total Body Water (lbs) N/A N/A 101.6 98.2   Preferred Learning Style:   No preference indicated   Learning Readiness:   Ready  24-hr recall: B (AM): Premier shake (30 g) Snk (AM): none  L (PM): protein shake or 1-2 slices of [place] or small salad with meat (7-30 g) Snk (PM): none or Triple zero oikos (0-15 g) D (PM): pork chops with steamed vegetables or stir fry  (7-10 g) Snk (PM): none  Has 1.5 - 2 protein shakes per day  Fluid intake: 68 oz of more water with lemon, 11-22 oz protein shake Estimated total protein intake: 60-67 g   Medications: none Supplementation: going to start calcium petite and taking bariatric advantage capsules and vitamin B12 and started taking biotin  Using straws: No Drinking while eating: No - caught herself before Hair loss: yes  Carbonated beverages: No N/V/D/C: having some constipation in the past week and planning to take Miralax Dumping syndrome: No  Recent physical activity:  Not able to exercise in past 3 weeks, planning to go back to BELT program  tomorrow  Progress Towards Goal(s):  In progress.  Handouts given during visit include:  none   Nutritional Diagnosis:  [place]-3.3 Overweight/obesity related to past poor dietary habits and physical inactivity as evidenced by patient w/ recent RYGB surgery following dietary guidelines for continued weight loss.   Intervention:  Nutrition education/diet advancement. Goals:  Follow Phase 3B: High Protein + Non-Starchy Vegetables  Eat 3-6 small meals/snacks, every 3-5 hrs  Increase lean protein foods to meet 60g goal  Increase fluid intake to 64oz +  Avoid drinking 15 minutes before, during and 30 minutes after eating  Aim for >30 min of physical activity daily  Try Baritastic App  700-900 calories  78-100 grams carbohydrates  60-80 grams protein  19-25 grams fat   Teaching Method Utilized:  Visual Auditory Hands on  Barriers to learning/adherence to lifestyle change: none  Demonstrated degree of understanding via:  Teach Back   Monitoring/Evaluation:  Dietary intake, exercise, and body weight. Follow up in 3 months for 6 month post-op visit.

## 2016-07-20 NOTE — Patient Instructions (Addendum)
Goals:  Follow Phase 3B: High Protein + Non-Starchy Vegetables  Eat 3-6 small meals/snacks, every 3-5 hrs  Increase lean protein foods to meet 60g goal  Increase fluid intake to 64oz +  Avoid drinking 15 minutes before, during and 30 minutes after eating  Aim for >30 min of physical activity daily  Try Baritastic App  700-900 calories  78-100 grams carbohydrates  60-80 grams protein  19-25 grams fat   Surgery date: 03/29/2016 Surgery type: RYGB Start weight at [facility]: 382 lbs on 12/03/2015 Weight today: 295.4 lbs   Weight change: 24.2 lbs Total weight loss: 86.6 lbs  TANITA  BODY COMP RESULTS  02/23/16 04/13/16 05/25/16 07/20/16   BMI (kg/m^2) 57.3 51.8 48.6 44.9   Fat Mass (lbs) 224.4 204.6 184.4 163.6   Fat Free Mass (lbs) 152.2 136.4 135.2 131.8   Total Body Water (lbs) N/A N/A 101.6 98.2

## 2016-09-22 ENCOUNTER — Ambulatory Visit: Payer: BLUE CROSS/BLUE SHIELD | Admitting: Dietician

## 2016-09-28 ENCOUNTER — Encounter: Payer: BLUE CROSS/BLUE SHIELD | Attending: General Surgery | Admitting: Dietician

## 2016-09-28 DIAGNOSIS — Z713 Dietary counseling and surveillance: Secondary | ICD-10-CM | POA: Diagnosis not present

## 2016-09-28 DIAGNOSIS — Z6841 Body Mass Index (BMI) 40.0 and over, adult: Secondary | ICD-10-CM | POA: Diagnosis not present

## 2016-09-28 NOTE — Patient Instructions (Addendum)
Goals:  Follow Phase 3B: High Protein + Non-Starchy Vegetables  Eat 3-6 small meals/snacks, every 3-5 hrs  Increase lean protein foods to meet 60g goal  Increase fluid intake to 64oz +  Avoid drinking 15 minutes before, during and 30 minutes after eating  Aim for >30 min of physical activity daily  Surgery date: 03/29/2016 Surgery type: RYGB Start weight at [facility]: 382 lbs on 12/03/2015 Weight today: 270 lbs   Weight change: 24.2 lbs Total weight loss: 112 lbs  TANITA  BODY COMP RESULTS  02/23/16 04/13/16 05/25/16 07/20/16 09/28/16   BMI (kg/m^2) 57.3 51.8 48.6 44.9 41.1   Fat Mass (lbs) 224.4 204.6 184.4 163.6 139.4   Fat Free Mass (lbs) 152.2 136.4 135.2 131.8 130.6   Total Body Water (lbs) N/A N/A 101.6 98.2 96.4

## 2016-09-28 NOTE — Progress Notes (Signed)
  Follow-up visit: 6 Months Post-Operative RYGB Surgery  Medical Nutrition Therapy:  Appt start time: 1000 end time:  1025  Primary concerns today: Post-operative Bariatric Surgery Nutrition Management. Returns with a 24.2 lbs weight loss in past 2.5 months.  Having some back pain and not been able to work out for about 5 weeks. Stopped BELT program for now. Going to see her pain specialist soon.   Having a lot of burping/hiccups and not sure why. May be due to eating too quickly. Overall things are goingwell.   Still using MyFitness pal. Feels like she is usually getting in her protein.    Has had a UTI since 07/21/2016. Drinking plenty of fluids.   Surgery date: 03/29/2016 Surgery type: RYGB Start weight at [facility]: 382 lbs on 12/03/2015 Weight today: 270 lbs   Weight change: 24.2 lbs Total weight loss: 112 lbs  TANITA  BODY COMP RESULTS  02/23/16 04/13/16 05/25/16 07/20/16 09/28/16   BMI (kg/m^2) 57.3 51.8 48.6 44.9 41.1   Fat Mass (lbs) 224.4 204.6 184.4 163.6 139.4   Fat Free Mass (lbs) 152.2 136.4 135.2 131.8 130.6   Total Body Water (lbs) N/A N/A 101.6 98.2 96.4   Preferred Learning Style:   No preference indicated   Learning Readiness:   Ready  24-hr recall: B (AM): Premier shake (30 g) Snk (AM): none  L (PM): protein shake or 1-2 slices of [place] or small salad with meat (7-30 g) Snk (PM): none or celery  D (PM): 2 oz pork chops with steamed vegetables or stir fry  (14 g) Snk (PM): none or sugar free popsicle  Has 1.5 - 2 protein shakes per day  Fluid intake: 68 oz of more water with lemon, 11-22 oz protein shake Estimated total protein intake: 60-67 g   Medications: none Supplementation: going to start calcium petite (doesn't always get it all in) and taking bariatric advantage capsules and vitamin B12 and started taking biotin  Using straws: No Drinking while eating: No - caught herself before Hair loss: yes  Carbonated beverages: No N/V/D/C: constipation is  better Dumping syndrome: No  Recent physical activity:  Not able to exercise in past 5 weeks  Progress Towards Goal(s):  In progress.  Handouts given during visit include:  none   Nutritional Diagnosis:  [place]-3.3 Overweight/obesity related to past poor dietary habits and physical inactivity as evidenced by patient w/ recent RYGB surgery following dietary guidelines for continued weight loss.   Intervention:  Nutrition education/diet advancement. Goals:  Follow Phase 3B: High Protein + Non-Starchy Vegetables  Eat 3-6 small meals/snacks, every 3-5 hrs  Increase lean protein foods to meet 60g goal  Increase fluid intake to 64oz +  Avoid drinking 15 minutes before, during and 30 minutes after eating  Aim for >30 min of physical activity daily  Teaching Method Utilized:  Visual Auditory Hands on  Barriers to learning/adherence to lifestyle change: none  Demonstrated degree of understanding via:  Teach Back   Monitoring/Evaluation:  Dietary intake, exercise, and body weight. Follow up in 3 months for 9 month post-op visit.

## 2016-10-29 ENCOUNTER — Other Ambulatory Visit: Payer: Self-pay | Admitting: Obstetrics and Gynecology

## 2016-10-29 ENCOUNTER — Other Ambulatory Visit (HOSPITAL_COMMUNITY)
Admission: RE | Admit: 2016-10-29 | Discharge: 2016-10-29 | Disposition: A | Discharge status: Home / Self Care | Payer: BLUE CROSS/BLUE SHIELD | Source: Ambulatory Visit | Attending: Obstetrics and Gynecology | Admitting: Obstetrics and Gynecology

## 2016-10-29 DIAGNOSIS — Z01411 Encounter for gynecological examination (general) (routine) with abnormal findings: Secondary | ICD-10-CM | POA: Diagnosis present

## 2016-10-29 DIAGNOSIS — Z1151 Encounter for screening for human papillomavirus (HPV): Secondary | ICD-10-CM | POA: Diagnosis not present

## 2016-11-03 LAB — CYTOLOGY - PAP
Diagnosis: NEGATIVE
HPV (WINDOPATH): NOT DETECTED

## 2016-11-30 IMAGING — DX DG CHEST 2V
2 series · 2 of 2 positions shown · non-contrast
Comparison: 12/03/2014

CLINICAL DATA: Pre-op for bariatric surgery next mo, no chest
complains, HTN, non smoker.

EXAM:
CHEST  2 VIEW

[chest pa]
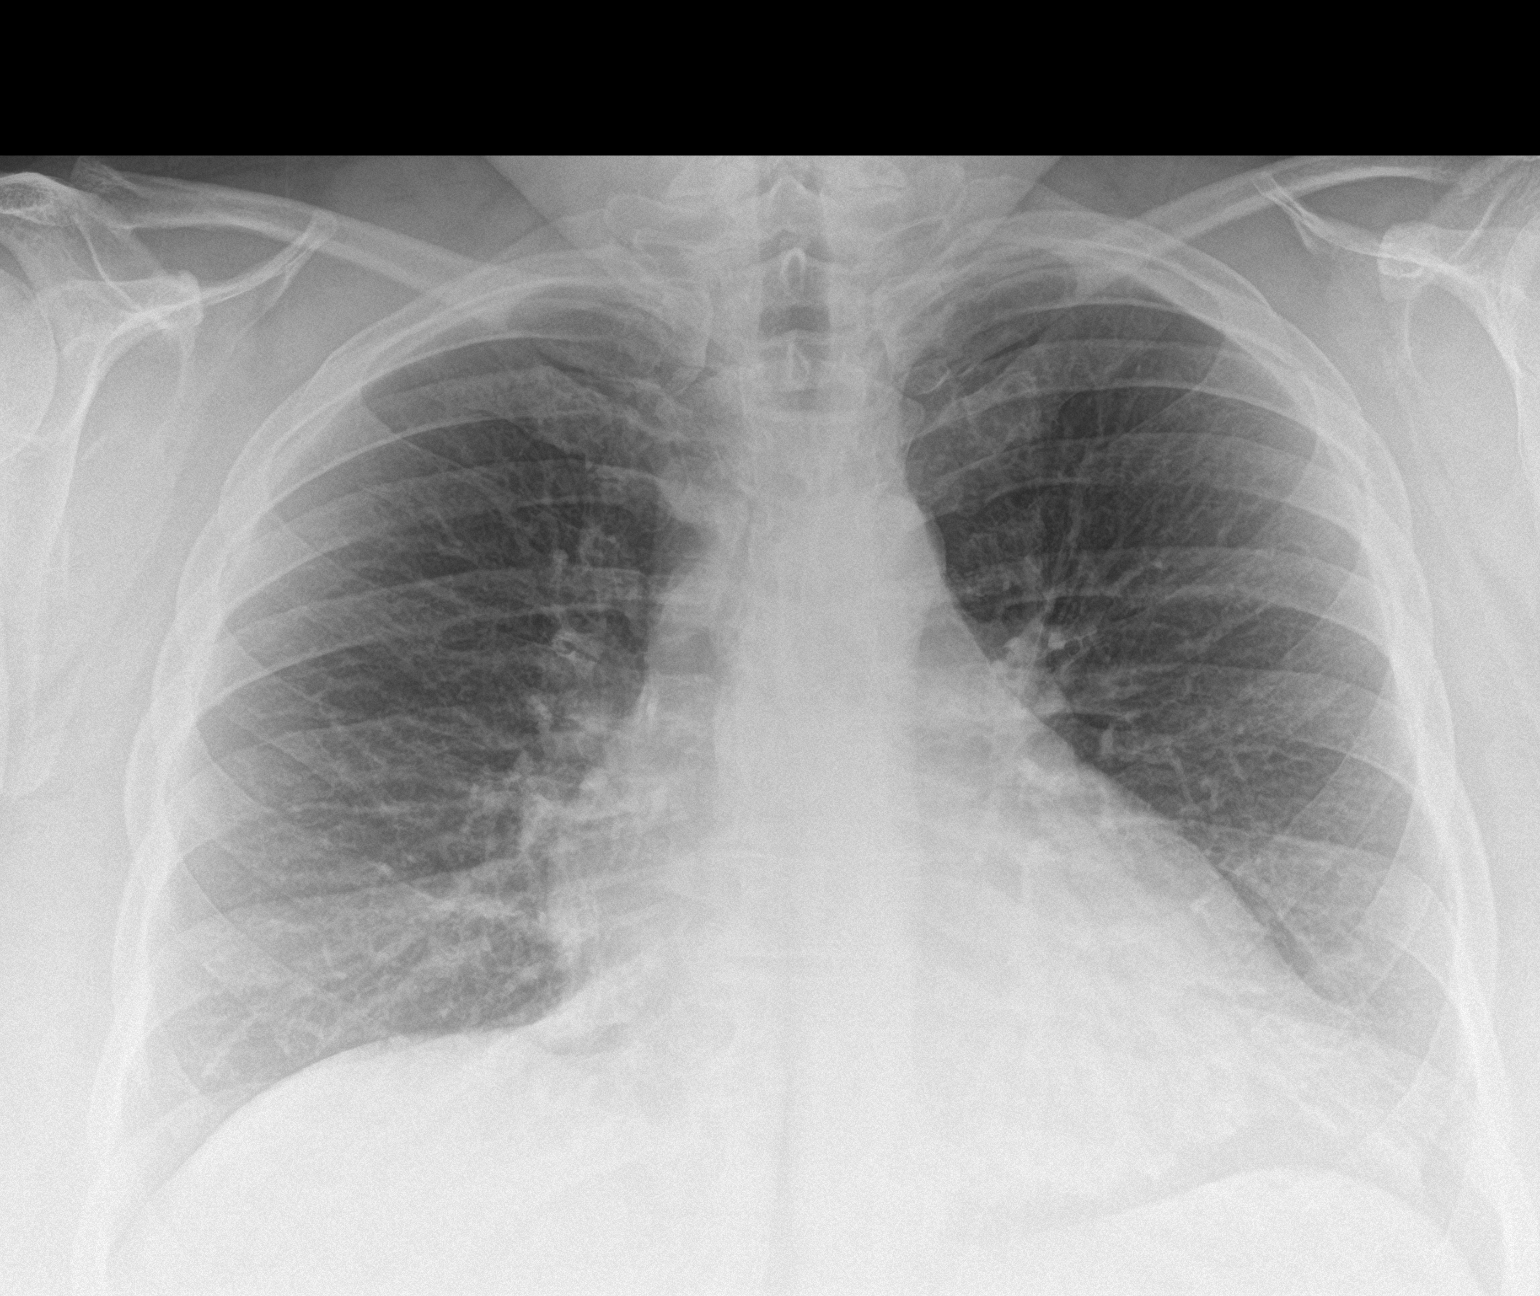

[chest lat]
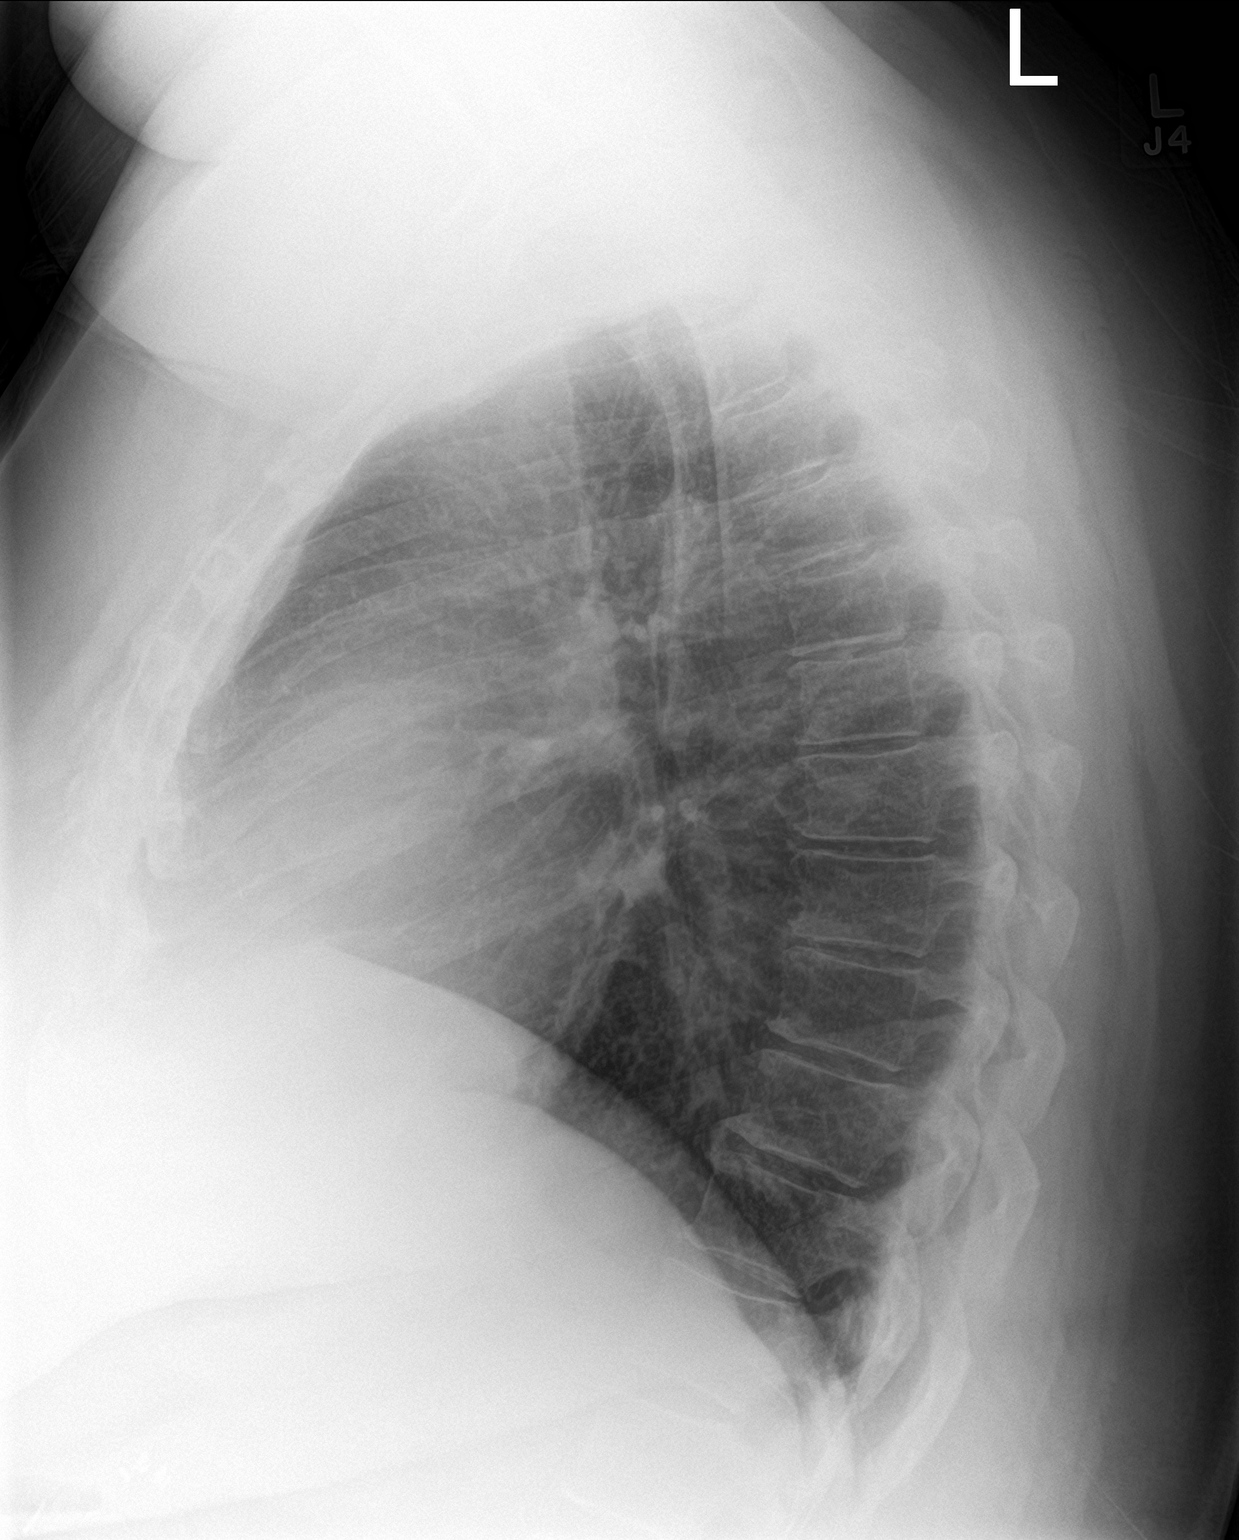

[2 of 2 positions shown; findings below may reference images not displayed]

FINDINGS: The heart is upper normal in size. The lungs are free of focal
consolidations and pleural effusions. There is mild lower thoracic
spondylosis.
IMPRESSION: No evidence for acute cardiopulmonary abnormality.

## 2016-12-21 ENCOUNTER — Encounter: Payer: BLUE CROSS/BLUE SHIELD | Attending: General Surgery | Admitting: Skilled Nursing Facility1

## 2016-12-21 ENCOUNTER — Encounter: Payer: Self-pay | Admitting: Skilled Nursing Facility1

## 2016-12-21 ENCOUNTER — Encounter (HOSPITAL_BASED_OUTPATIENT_CLINIC_OR_DEPARTMENT_OTHER): Payer: Self-pay | Admitting: *Deleted

## 2016-12-21 DIAGNOSIS — Z713 Dietary counseling and surveillance: Secondary | ICD-10-CM | POA: Insufficient documentation

## 2016-12-21 DIAGNOSIS — Z6841 Body Mass Index (BMI) 40.0 and over, adult: Secondary | ICD-10-CM | POA: Insufficient documentation

## 2016-12-21 NOTE — Progress Notes (Signed)
  Follow-up visit: 6 Months Post-Operative RYGB Surgery  Medical Nutrition Therapy:  Appt start time: 1000 end time:  1025  Primary concerns today: Post-operative Bariatric Surgery Nutrition Management. Returns with a 24.2 lbs weight loss in past 2.5 months.  Having some back pain and not been able to work out for about 5 weeks. Stopped BELT program for now. Going to see her pain specialist soon.   Having a lot of burping/hiccups and not sure why. May be due to eating too quickly. Overall things are goingwell.   Still using MyFitness pal. Feels like she is usually getting in her protein.    Has had a UTI since 07/21/2016. Drinking plenty of fluids.    Pt states she Will get a ablasion and fibroid removed from her uterus., Pt states she has bad back issues, and gets steroid shots. Pt is worried about her very slow wt loss.  Surgery date: 03/29/2016 Surgery type: RYGB Start weight at [facility]: 382 lbs on 12/03/2015 Weight today: 246 lbs   Weight change: 24 lbs Total weight loss: 136 lbs  TANITA  BODY COMP RESULTS  02/23/16 04/13/16 05/25/16 07/20/16 09/28/16 12/21/2016   BMI (kg/m^2) 57.3 51.8 48.6 44.9 41.1 37.4   Fat Mass (lbs) 224.4 204.6 184.4 163.6 139.4 121.2   Fat Free Mass (lbs) 152.2 136.4 135.2 131.8 130.6 124.8   Total Body Water (lbs) N/A N/A 101.6 98.2 96.4 91.2   Preferred Learning Style:   No preference indicated   Learning Readiness:   Ready  24-hr recall: B (AM): Premier shake (30 g) Snk (AM): none  L (PM): protein shake or 1-2 slices of [place] or small salad with meat (7-30 g) Snk (PM): none or celery  D (PM): 2 oz pork chops with steamed vegetables or stir fry  (14 g) Snk (PM): none or sugar free popsicle  Has 1.5 - 2 protein shakes per day  Fluid intake: 68 oz of more water with lemon, 11-22 oz protein shake Estimated total protein intake: 60-67 g   Medications: taking losarten/HTZ now Supplementation: bariatric choice multi and an iron and calcium   Using  straws: No Drinking while eating: No - caught herself before Hair loss: yes  Carbonated beverages: No N/V/D/C: constipation is better Dumping syndrome: No  Recent physical activity:  Not able to exercise in past 5 weeks  Progress Towards Goal(s):  In progress.  Handouts given during visit include:  none   Nutritional Diagnosis:  [place]-3.3 Overweight/obesity related to past poor dietary habits and physical inactivity as evidenced by patient w/ recent RYGB surgery following dietary guidelines for continued weight loss.   Intervention:  Nutrition education/diet advancement. Goals: -15 grams or more of protein and 5 grams or less of carbohydrate  -On your food label you are looking for single digits of fat and sugar   700-900 calories  78-100 grams carbohydrates  60-80 grams protein  19-25 grams fat -Eat more beans -Have vegetables for lunch and dinner every day  Teaching Method Utilized:  Visual Auditory Hands on  Barriers to learning/adherence to lifestyle change: none  Demonstrated degree of understanding via:  Teach Back   Monitoring/Evaluation:  Dietary intake, exercise, and body weight. Follow up in 3 months for 9 month post-op visit.

## 2016-12-21 NOTE — Patient Instructions (Addendum)
-  15 grams or more of protein and 5 grams or less of carbohydrate   -On your food label you are looking for single digits of fat and sugar    700-900 calories  78-100 grams carbohydrates  60-80 grams protein  19-25 grams fat  -Eat more beans -Have vegetables for lunch and dinner every day

## 2016-12-23 ENCOUNTER — Encounter (HOSPITAL_BASED_OUTPATIENT_CLINIC_OR_DEPARTMENT_OTHER): Payer: Self-pay | Admitting: *Deleted

## 2016-12-23 NOTE — Progress Notes (Signed)
Pt instructed npo pmn 4/10 x pain med w sip of water.  To [facility] 4/11 @ 0945.  Needs cbc, istat, T&S, urine hcg on arrival.

## 2016-12-28 NOTE — H&P (Signed)
History of Present Illness  General:  Pt reports she skipped her period last month. Pt's main concern is with the pain of her menses. Missed school with menses when she was younger.Periods are heavy but she feels like she can deal with that. Pt works from home and has available time.   Current Medications  Taking   Methocarbamol 500 MG Tablet 1.5 tablets Orally every 4 hrs   Meloxicam 15 MG Tablet 1 tablet Orally Once a day   Vitamin B 12 10000 UNIT Capsule 1 capsule with food or milk Orally Once a day   Multivitamin . Tablet 1 tablet by mouth Once daily   Vitamin D 3 50,000 units pill 1 pill orally once a week   Cyclobenzaprine HCl 5 MG Tablet 1 tablet as needed Orally Three times a day   Losartan Potassium-HCTZ 50-12.5 MG Tablet 1 tablet Orally Once a day   Medication List reviewed and reconciled with the patient    Past Medical History  HTN.   Arthritis in back.   Hep C-s/p treatment, viral load neg (PA Dawn Dzorsky).           Surgical History  Gastric Bypass (Roux-en-Y) 2017  Lumbar discetomy 2005  gallbladder 1990's  appendectomy 19990s   Family History  Father: deceased, throat cancer that went to liver and lung  Mother: alive, diagnosed with Diabetes, Hypertension  Maternal Grand Mother: Colon cancer   Social History  General:  Tobacco use  cigarettes: Never smoked Tobacco history last updated 12/13/2016 no Alcohol.  no Recreational drug use.  Exercise: yes, 3 times weekly.  Marital Status: single.  Children: none.    Gyn History  Sexual activity not currently sexually active.  Periods : most of time they are monthly.  LMP 12/13/16.  Denies H/O Birth control.  Last pap smear date 10/2016, all negative.  Last mammogram date 09/2016.  Abnormal pap smear assessed with colposcopy, maybe.  STD Chlamydia.    OB History  Number of pregnancies 1.  Pregnancy # 1 miscarriage.    Allergies  COCONUT   Hospitalization/Major Diagnostic Procedure  see  surgery    Vital Signs  Wt 248, Wt change -10 lb, Ht 68, BMI 37.70, Pulse sitting 69, BP sitting 115/64.   Physical Examination  GENERAL:  Patient appears alert and oriented.  General Appearance: well-appearing, well-developed, no acute distress.  Speech: clear.  FEMALE GENITOURINARY:  Cervix visualized, healthy appearing, no discharge, no lesions.  Vagina: pink/moist mucosa, no lesions, no abnormal discharge.  Vulva: normal, no lesions, no skin discoloration.     Assessments   1. Menorrhagia with regular cycle - N92.0 (Primary)   2. Submucous uterine fibroid - D25.0   Treatment  1. Menorrhagia with regular cycle  Imaging: [place] SONOHYSTEROGRAM Notes: Pt may be perimenopausal due to skipping menses. If that is the case, menses may start to decrease infrequency. Due to pt's h/o multiple abdominal surgery, obesity and likelyhood that pt is perimenopausal, recommend Hyst/D&C, Myomectomy w/ HTA endometrial ablation. Lining is thin so ablation will likely stop menses. Pt informed that Ablation is not indicated for pain and that for definitive management hysterectomy recommended. Procedure reviewed. Pamphlets given. Preop clearance from PCP.  Referral IN:[facility] - [facility]necology Reason:Precert for Hysteroscopy/D&C, Myosure (XL blade), HTA ablation to be done at [facility] preop clearance    Procedures  SHG:  Indication menorrhagia.  Consent informed consent obtained.  Pregnancy Test Negative.  Procedure Cervical Prep w betadine, catheter passed without problem.  Tenaculum no.  dilatation no.  Post procedure patient tolerated well.  Findings 1-2 cm submucosal fibroid. Almost fills endometrial cavity..       Procedure Codes  [phone] SIS W OR W O COLOR FLOW DPLR  58340 CATH INTRO SALINE CONTRAST MATERIAL  20947 ECHO TRANSVAGINAL   Follow Up  prn (Reason: post op)

## 2016-12-29 ENCOUNTER — Encounter (HOSPITAL_BASED_OUTPATIENT_CLINIC_OR_DEPARTMENT_OTHER): Payer: Self-pay | Admitting: *Deleted

## 2016-12-29 ENCOUNTER — Encounter (HOSPITAL_BASED_OUTPATIENT_CLINIC_OR_DEPARTMENT_OTHER)
Admission: RE | Disposition: A | Discharge status: Home / Self Care | Payer: Self-pay | Source: Ambulatory Visit | Attending: Obstetrics and Gynecology

## 2016-12-29 ENCOUNTER — Ambulatory Visit (HOSPITAL_BASED_OUTPATIENT_CLINIC_OR_DEPARTMENT_OTHER): Payer: BLUE CROSS/BLUE SHIELD | Admitting: Anesthesiology

## 2016-12-29 ENCOUNTER — Ambulatory Visit (HOSPITAL_BASED_OUTPATIENT_CLINIC_OR_DEPARTMENT_OTHER)
Admission: RE | Admit: 2016-12-29 | Discharge: 2016-12-29 | Disposition: A | Discharge status: Home / Self Care | Payer: BLUE CROSS/BLUE SHIELD | Source: Ambulatory Visit | Attending: Obstetrics and Gynecology | Admitting: Obstetrics and Gynecology

## 2016-12-29 DIAGNOSIS — Z6837 Body mass index (BMI) 37.0-37.9, adult: Secondary | ICD-10-CM | POA: Insufficient documentation

## 2016-12-29 DIAGNOSIS — I10 Essential (primary) hypertension: Secondary | ICD-10-CM | POA: Diagnosis not present

## 2016-12-29 DIAGNOSIS — D250 Submucous leiomyoma of uterus: Secondary | ICD-10-CM | POA: Insufficient documentation

## 2016-12-29 DIAGNOSIS — K219 Gastro-esophageal reflux disease without esophagitis: Secondary | ICD-10-CM | POA: Insufficient documentation

## 2016-12-29 DIAGNOSIS — N920 Excessive and frequent menstruation with regular cycle: Secondary | ICD-10-CM | POA: Diagnosis not present

## 2016-12-29 DIAGNOSIS — G4730 Sleep apnea, unspecified: Secondary | ICD-10-CM | POA: Diagnosis not present

## 2016-12-29 DIAGNOSIS — M1991 Primary osteoarthritis, unspecified site: Secondary | ICD-10-CM | POA: Diagnosis not present

## 2016-12-29 HISTORY — DX: Unspecified osteoarthritis, unspecified site: M19.90

## 2016-12-29 HISTORY — PX: DILITATION & CURRETTAGE/HYSTROSCOPY WITH HYDROTHERMAL ABLATION: SHX5570

## 2016-12-29 HISTORY — PX: DILATATION & CURETTAGE/HYSTEROSCOPY WITH MYOSURE: SHX6511

## 2016-12-29 HISTORY — DX: Other chronic pain: G89.29

## 2016-12-29 HISTORY — DX: Bariatric surgery status: Z98.84

## 2016-12-29 HISTORY — DX: Personal history of other infectious and parasitic diseases: Z86.19

## 2016-12-29 HISTORY — DX: Low back pain, unspecified: M54.50

## 2016-12-29 HISTORY — DX: Dependence on other enabling machines and devices: Z99.89

## 2016-12-29 HISTORY — DX: Leiomyoma of uterus, unspecified: D25.9

## 2016-12-29 HISTORY — DX: Low back pain: M54.5

## 2016-12-29 HISTORY — DX: Fatty (change of) liver, not elsewhere classified: K76.0

## 2016-12-29 HISTORY — DX: Obstructive sleep apnea (adult) (pediatric): G47.33

## 2016-12-29 HISTORY — DX: Personal history of urinary (tract) infections: Z87.440

## 2016-12-29 LAB — POCT I-STAT, CHEM 8
BUN: 14 mg/dL (ref 6–20)
CALCIUM ION: 1.28 mmol/L (ref 1.15–1.40)
Chloride: 98 mmol/L — ABNORMAL LOW (ref 101–111)
Creatinine, Ser: 0.6 mg/dL (ref 0.44–1.00)
GLUCOSE: 90 mg/dL (ref 65–99)
HCT: 48 % — ABNORMAL HIGH (ref 36.0–46.0)
Hemoglobin: 16.3 g/dL — ABNORMAL HIGH (ref 12.0–15.0)
Potassium: 3.4 mmol/L — ABNORMAL LOW (ref 3.5–5.1)
SODIUM: 142 mmol/L (ref 135–145)
TCO2: 29 mmol/L (ref 0–100)

## 2016-12-29 LAB — CBC
HCT: 46.2 % — ABNORMAL HIGH (ref 36.0–46.0)
Hemoglobin: 16.1 g/dL — ABNORMAL HIGH (ref 12.0–15.0)
MCH: 31.1 pg (ref 26.0–34.0)
MCHC: 34.8 g/dL (ref 30.0–36.0)
MCV: 89.4 fL (ref 78.0–100.0)
PLATELETS: 296 10*3/uL (ref 150–400)
RBC: 5.17 MIL/uL — ABNORMAL HIGH (ref 3.87–5.11)
RDW: 13 % (ref 11.5–15.5)
WBC: 7.8 10*3/uL (ref 4.0–10.5)

## 2016-12-29 LAB — TYPE AND SCREEN
ABO/RH(D): A POS
Antibody Screen: NEGATIVE

## 2016-12-29 LAB — ABO/RH: ABO/RH(D): A POS

## 2016-12-29 LAB — POCT PREGNANCY, URINE: PREG TEST UR: NEGATIVE

## 2016-12-29 SURGERY — DILATATION & CURETTAGE/HYSTEROSCOPY WITH HYDROTHERMAL ABLATION
Anesthesia: General

## 2016-12-29 MED ORDER — PROPOFOL 10 MG/ML IV BOLUS
INTRAVENOUS | Status: AC
  Filled 2016-12-29: qty 40

## 2016-12-29 MED ORDER — LACTATED RINGERS IV SOLN
INTRAVENOUS | Status: DC
  Administered 2016-12-29 (×2): via INTRAVENOUS
  Filled 2016-12-29: qty 1000

## 2016-12-29 MED ORDER — MIDAZOLAM HCL 2 MG/2ML IJ SOLN
INTRAMUSCULAR | Status: AC
  Filled 2016-12-29: qty 2

## 2016-12-29 MED ORDER — IBUPROFEN 800 MG PO TABS: 800.0000 mg | ORAL_TABLET | Freq: Three times a day (TID) | ORAL | 0 refills | Status: DC | PRN

## 2016-12-29 MED ORDER — KETOROLAC TROMETHAMINE 30 MG/ML IJ SOLN
INTRAMUSCULAR | Status: AC
  Filled 2016-12-29: qty 2

## 2016-12-29 MED ORDER — LIDOCAINE HCL 2 % IJ SOLN
INTRAMUSCULAR | Status: AC
Start: 2016-12-29 — End: 2016-12-29
  Filled 2016-12-29: qty 20

## 2016-12-29 MED ORDER — ONDANSETRON HCL 4 MG/2ML IJ SOLN
INTRAMUSCULAR | Status: AC
Start: 2016-12-29 — End: 2016-12-29
  Filled 2016-12-29: qty 2

## 2016-12-29 MED ORDER — OXYCODONE HCL 5 MG PO TABS
ORAL_TABLET | ORAL | Status: AC
  Filled 2016-12-29: qty 1

## 2016-12-29 MED ORDER — OXYCODONE-ACETAMINOPHEN 5-325 MG PO TABS: 1.0000 | ORAL_TABLET | ORAL | 0 refills | Status: DC | PRN

## 2016-12-29 MED ORDER — ONDANSETRON HCL 4 MG/2ML IJ SOLN
INTRAMUSCULAR | Status: DC | PRN
  Administered 2016-12-29: 4 mg via INTRAVENOUS

## 2016-12-29 MED ORDER — KETOROLAC TROMETHAMINE 30 MG/ML IJ SOLN
INTRAMUSCULAR | Status: AC
Start: 2016-12-29 — End: 2016-12-29
  Filled 2016-12-29: qty 1

## 2016-12-29 MED ORDER — FENTANYL CITRATE (PF) 100 MCG/2ML IJ SOLN
25.0000 ug | INTRAMUSCULAR | Status: DC | PRN
  Administered 2016-12-29: 50 ug via INTRAVENOUS
  Administered 2016-12-29: 25 ug via INTRAVENOUS
  Filled 2016-12-29: qty 1

## 2016-12-29 MED ORDER — FENTANYL CITRATE (PF) 100 MCG/2ML IJ SOLN
INTRAMUSCULAR | Status: AC
  Filled 2016-12-29: qty 2

## 2016-12-29 MED ORDER — LIDOCAINE HCL 2 % IJ SOLN
INTRAMUSCULAR | Status: DC | PRN
Start: 2016-12-29 — End: 2016-12-29
  Administered 2016-12-29: 2 mL

## 2016-12-29 MED ORDER — SODIUM CHLORIDE 0.9 % IR SOLN
Status: DC | PRN
  Administered 2016-12-29: 3000 mL

## 2016-12-29 MED ORDER — LIDOCAINE 2% (20 MG/ML) 5 ML SYRINGE
INTRAMUSCULAR | Status: AC
Start: 2016-12-29 — End: 2016-12-29
  Filled 2016-12-29: qty 5

## 2016-12-29 MED ORDER — OXYCODONE HCL 5 MG/5ML PO SOLN
5.0000 mg | Freq: Once | ORAL | Status: AC | PRN
  Filled 2016-12-29: qty 5

## 2016-12-29 MED ORDER — FENTANYL CITRATE (PF) 100 MCG/2ML IJ SOLN
INTRAMUSCULAR | Status: DC | PRN
  Administered 2016-12-29 (×4): 50 ug via INTRAVENOUS

## 2016-12-29 MED ORDER — FENTANYL CITRATE (PF) 100 MCG/2ML IJ SOLN
INTRAMUSCULAR | Status: AC
  Filled 2016-12-29: qty 4

## 2016-12-29 MED ORDER — PROPOFOL 500 MG/50ML IV EMUL
INTRAVENOUS | Status: DC | PRN
  Administered 2016-12-29: 200 mL via INTRAVENOUS

## 2016-12-29 MED ORDER — OXYCODONE HCL 5 MG PO TABS
5.0000 mg | ORAL_TABLET | Freq: Once | ORAL | Status: AC | PRN
  Administered 2016-12-29: 5 mg via ORAL
  Filled 2016-12-29: qty 1

## 2016-12-29 MED ORDER — MIDAZOLAM HCL 5 MG/5ML IJ SOLN
INTRAMUSCULAR | Status: DC | PRN
  Administered 2016-12-29: 2 mg via INTRAVENOUS

## 2016-12-29 MED ORDER — DEXAMETHASONE SODIUM PHOSPHATE 10 MG/ML IJ SOLN
INTRAMUSCULAR | Status: AC
  Filled 2016-12-29: qty 1

## 2016-12-29 MED ORDER — LIDOCAINE 2% (20 MG/ML) 5 ML SYRINGE
INTRAMUSCULAR | Status: AC
  Filled 2016-12-29: qty 5

## 2016-12-29 MED ORDER — ONDANSETRON HCL 4 MG/2ML IJ SOLN
INTRAMUSCULAR | Status: AC
  Filled 2016-12-29: qty 2

## 2016-12-29 MED ORDER — DEXAMETHASONE SODIUM PHOSPHATE 10 MG/ML IJ SOLN
INTRAMUSCULAR | Status: DC | PRN
  Administered 2016-12-29: 10 mg via INTRAVENOUS

## 2016-12-29 MED ORDER — ONDANSETRON HCL 4 MG/2ML IJ SOLN
4.0000 mg | Freq: Once | INTRAMUSCULAR | Status: AC
  Administered 2016-12-29: 4 mg via INTRAVENOUS
  Filled 2016-12-29: qty 2

## 2016-12-29 MED ORDER — LIDOCAINE 2% (20 MG/ML) 5 ML SYRINGE
INTRAMUSCULAR | Status: DC | PRN
  Administered 2016-12-29: 80 mg via INTRAVENOUS

## 2016-12-29 MED ORDER — SILVER NITRATE-POT NITRATE 75-25 % EX MISC
CUTANEOUS | Status: AC
  Filled 2016-12-29: qty 1

## 2016-12-29 MED ORDER — KETOROLAC TROMETHAMINE 30 MG/ML IJ SOLN
INTRAMUSCULAR | Status: DC | PRN
  Administered 2016-12-29: 30 mg via INTRAVENOUS

## 2016-12-29 SURGICAL SUPPLY — 43 items
BIPOLAR CUTTING LOOP 21FR (ELECTRODE)
CANISTER SUCT 3000ML PPV (MISCELLANEOUS) ×5 IMPLANT
CATH ROBINSON RED A/P 16FR (CATHETERS) ×3 IMPLANT
CLOTH BEACON ORANGE TIMEOUT ST (SAFETY) ×3 IMPLANT
CONTAINER PREFILL 10% NBF 60ML (FORM) ×6 IMPLANT
COVER BACK TABLE 60X90IN (DRAPES) ×3 IMPLANT
DEVICE MYOSURE LITE (MISCELLANEOUS) IMPLANT
DEVICE MYOSURE REACH (MISCELLANEOUS) IMPLANT
DILATOR CANAL MILEX (MISCELLANEOUS) IMPLANT
DRAPE HYSTEROSCOPY (DRAPE) ×3 IMPLANT
DRAPE LG THREE QUARTER DISP (DRAPES) ×3 IMPLANT
DRSG TELFA 3X8 NADH (GAUZE/BANDAGES/DRESSINGS) ×3 IMPLANT
GLOVE BIO SURGEON STRL SZ7 (GLOVE) ×3 IMPLANT
GLOVE BIOGEL PI IND STRL 7.0 (GLOVE) ×2 IMPLANT
GLOVE BIOGEL PI INDICATOR 7.0 (GLOVE) ×6
GLOVE INDICATOR 7.0 STRL GRN (GLOVE) ×2 IMPLANT
GLOVE SURG SS PI 7.0 STRL IVOR (GLOVE) ×1 IMPLANT
GOWN STRL REUS W/ TWL LRG LVL3 (GOWN DISPOSABLE) ×1 IMPLANT
GOWN STRL REUS W/TWL LRG LVL3 (GOWN DISPOSABLE) ×6 IMPLANT
KIT RM TURNOVER CYSTO AR (KITS) ×3 IMPLANT
LEGGING LITHOTOMY PAIR STRL (DRAPES) ×1 IMPLANT
LOOP CUTTING BIPOLAR 21FR (ELECTRODE) IMPLANT
MYOSURE XL FIBROID REM (MISCELLANEOUS) ×3
NDL SPNL 22GX3.5 QUINCKE BK (NEEDLE) ×1 IMPLANT
NEEDLE SPNL 22GX3.5 QUINCKE BK (NEEDLE) IMPLANT
NS IRRIG 500ML POUR BTL (IV SOLUTION) IMPLANT
PACK BASIN DAY SURGERY FS (CUSTOM PROCEDURE TRAY) ×1 IMPLANT
PACK VAGINAL MINOR WOMEN LF (CUSTOM PROCEDURE TRAY) ×3 IMPLANT
PAD DRESSING TELFA 3X8 NADH (GAUZE/BANDAGES/DRESSINGS) IMPLANT
PAD OB MATERNITY 4.3X12.25 (PERSONAL CARE ITEMS) ×3 IMPLANT
PAD PREP 24X48 CUFFED NSTRL (MISCELLANEOUS) ×1 IMPLANT
SEAL ROD LENS SCOPE MYOSURE (ABLATOR) ×3 IMPLANT
SET GENESYS HTA PROCERVA (MISCELLANEOUS) ×3 IMPLANT
SURGILUBE 2OZ TUBE FLIPTOP (MISCELLANEOUS) ×1 IMPLANT
SYR CONTROL 10ML LL (SYRINGE) ×1 IMPLANT
SYSTEM TISS REMOVAL MYSR XL RM (MISCELLANEOUS) IMPLANT
TOWEL OR 17X24 6PK STRL BLUE (TOWEL DISPOSABLE) ×6 IMPLANT
TRAY DSU PREP LF (CUSTOM PROCEDURE TRAY) ×1 IMPLANT
TUBE CONNECTING 12'X1/4 (SUCTIONS)
TUBE CONNECTING 12X1/4 (SUCTIONS) IMPLANT
TUBING AQUILEX INFLOW (TUBING) ×3 IMPLANT
TUBING AQUILEX OUTFLOW (TUBING) ×3 IMPLANT
WATER STERILE IRR 500ML POUR (IV SOLUTION) IMPLANT

## 2016-12-29 NOTE — Progress Notes (Signed)
Pt seen.  Consent reviewed and signed.  Questions answered. No changes.

## 2016-12-29 NOTE — Transfer of Care (Signed)
Immediate Anesthesia Transfer of Care Note  Patient: Kathryn Washington  Procedure(s) Performed: Procedure(s): DILATATION & CURETTAGE/HYSTEROSCOPY WITH HYDROTHERMAL ABLATION (N/A) MYOSURE  (N/A)  Patient Location: PACU  Anesthesia Type:General  Level of Consciousness: awake, alert , oriented and patient cooperative  Airway & Oxygen Therapy: Patient Spontanous Breathing and Patient connected to nasal cannula oxygen  Post-op Assessment: Report given to RN and Post -op Vital signs reviewed and stable  Post vital signs: Reviewed and stable  Last Vitals:  Vitals:   12/29/16 0949  BP: (!) 108/59  Pulse: 73  Resp: 19  Temp: 36.3 C    Last Pain:  Vitals:   12/29/16 0949  TempSrc: Oral      Patients Stated Pain Goal: 6 (74/93/55 2174)  Complications: No apparent anesthesia complications

## 2016-12-29 NOTE — Brief Op Note (Addendum)
12/29/2016  1:02 PM  PATIENT:  Shirlee Latch  51 y.o. female  PRE-OPERATIVE DIAGNOSIS:  Menorrhagia, Submucosal Fibroids  POST-OPERATIVE DIAGNOSIS:  Same  PROCEDURE:  Procedure(s): DILATATION & CURETTAGE/HYSTEROSCOPY WITH HYDROTHERMAL ABLATION (N/A) MYOSURE  (N/A)  Hysteroscopic myomectomy  SURGEON:  Surgeon(s) and Role:    * Thurnell Lose, MD - Primary  PHYSICIAN ASSISTANT: None  ASSISTANTS: none   ANESTHESIA:   local and general  EBL:  Total I/O In: 400 [I.V.:400] Out: 150 [Urine:100; Blood:50]  BLOOD ADMINISTERED:none  DRAINS: none   LOCAL MEDICATIONS USED:  2 % XYLOCAINE  and Amount: less than 10  ml  SPECIMEN:  Source of Specimen:  Endometrial currettings, fibroid  DISPOSITION OF SPECIMEN:  PATHOLOGY  COUNTS:  YES  TOURNIQUET:  * No tourniquets in log *  DICTATION: .Other Dictation: Dictation Number [id number]  PLAN OF CARE: Discharge to home after PACU  PATIENT DISPOSITION:  PACU - hemodynamically stable.   Delay start of Pharmacological VTE agent (>24hrs) due to surgical blood loss or risk of bleeding: yes

## 2016-12-29 NOTE — Anesthesia Procedure Notes (Signed)
Procedure Name: LMA Insertion Date/Time: 12/29/2016 11:50 AM Performed by: Wanita Chamberlain Pre-anesthesia Checklist: Patient identified, Timeout performed, Emergency Drugs available, Suction available and Patient being monitored Patient Re-evaluated:Patient Re-evaluated prior to inductionOxygen Delivery Method: Circle system utilized Preoxygenation: Pre-oxygenation with 100% oxygen Intubation Type: IV induction Ventilation: Mask ventilation without difficulty LMA: LMA inserted LMA Size: 4.0 Number of attempts: 1 Placement Confirmation: CO2 detector,  breath sounds checked- equal and bilateral and positive ETCO2 Tube secured with: Tape

## 2016-12-29 NOTE — Anesthesia Preprocedure Evaluation (Addendum)
Anesthesia Evaluation  Patient identified by MRN, date of birth, ID band Patient awake    Reviewed: Allergy & Precautions, NPO status , Patient's Chart, lab work & pertinent test results  History of Anesthesia Complications (+) PONV and history of anesthetic complications  Airway Mallampati: II  TM Distance: >3 FB Neck ROM: Full    Dental  (+) Teeth Intact, Dental Advisory Given   Pulmonary sleep apnea ,    breath sounds clear to auscultation       Cardiovascular hypertension, Pt. on medications  Rhythm:Regular Rate:Normal     Neuro/Psych  Headaches,    GI/Hepatic Neg liver ROS, GERD  ,  Endo/Other  Morbid obesity  Renal/GU negative Renal ROS     Musculoskeletal  (+) Arthritis ,   Abdominal   Peds  Hematology   Anesthesia Other Findings   Reproductive/Obstetrics                            Anesthesia Physical Anesthesia Plan  ASA: II  Anesthesia Plan: General   Post-op Pain Management:    Induction: Intravenous  Airway Management Planned: LMA  Additional Equipment: None  Intra-op Plan:   Post-operative Plan: Extubation in OR  Informed Consent: I have reviewed the patients History and Physical, chart, labs and discussed the procedure including the risks, benefits and alternatives for the proposed anesthesia with the patient or authorized representative who has indicated his/her understanding and acceptance.   Dental advisory given  Plan Discussed with: CRNA and Surgeon  Anesthesia Plan Comments:         Anesthesia Quick Evaluation

## 2016-12-29 NOTE — Op Note (Signed)
NAMEYANETT, CONKRIGHT NO.:  [id number]  MEDICAL RECORD NO.:  [id number]  LOCATION:                                FACILITY:  [facility]  PHYSICIAN:  Jola Schmidt, MD   DATE OF BIRTH:  12-04-1965  DATE OF PROCEDURE:  12/29/2016 DATE OF DISCHARGE:  12/29/2016                              OPERATIVE REPORT   PREOPERATIVE DIAGNOSES:  Menorrhagia and submucosal fibroid.  PROCEDURE:  Hysteroscopy, D and C with hydrothermal ablation, and hysteroscopic myomectomy via MyoSure.  SURGEON:  Raul Del. Simona Huh, MD.  ASSISTANT:  None.  ANESTHESIA:  Local and general.  ESTIMATED BLOOD LOSS:  50.  URINE:  100.  DRAINS:  None.  LOCAL:  2% lidocaine less than 10 mL.  SPECIMEN:  Endometrial curettings and fibroids.  DISPOSITION OF SPECIMEN:  To pathology.  COUNTS CORRECT:  Yes.  PATIENT DISPOSITION:  To PACU, hemodynamically stable.  FINDINGS:  Mucosal fibroid does not occupy the entire cavity. Endometrial hyperplasia suspected.  Normal appearance of the uterine cavity.  Cervix is normal.  PROCEDURE IN DETAIL:  The patient was identified in the holding area. She was then taken to the operating room with IV running.  She was placed in the dorsal lithotomy position and underwent general endotracheal anesthesia without complication.  A time-out was performed. SCDs were on and operating.  The patient was prepped and draped in normal sterile fashion.  Graves speculum was inserted into the vagina.  Cervix was visualized. Anterior lip of the cervix was infiltrated with lidocaine.  Then, a single-tooth tenaculum was applied to the anterior lip of the cervix. Cervix was easily dilated to a 24 Pratt.  The hysteroscope was then advanced to reveal the findings above.  The patient had a lot of endometrial tissue and it was difficult to get a good picture of the fibroid.  Also, the uterus was not as well distended, but there was a lot of endometrium.  The XL MyoSure blade  was then advanced through the device and the fibroid was resected without complication.  Also, the endometrium was then resected with some of the MyoSure.  The cavity appeared normal and clean.  No signs of perforation.  The hysteroscope was then removed and the MyoSure was removed.  We then set up with a new device and the HTA device was then advanced through the cervix.  Initially, the picture was very foggy and the endometrial cavity could not be visualized.  It was then removed and we got another camera and that resolved the issue.  The HTA was then activated during the warmer period.  The leakage was detected.  There was a little bit of formed fluid but not high in the posterior fornix.  The posterior cervix was then grasped with a single- tooth tenaculum.  The device was then activated again, and I proceeded with the ablation without difficulty.  No further signs of leakage.  Once the ablation was completed, it was difficult to see the inside of the cavity during the procedure, but afterwards, an adequate ablation appeared normal.  Again, a lot of the endometrium had been adequately ablated as well and some clotting was noted.  The hysteroscope was then removed.  A sharp curettage of all 4 quadrants of the uterus was performed that was sent to Pathology.  The stalk that contained the submucosal fibroid was also retrieved and also sent to Pathology.  The single-tooth tenaculum was taken off the posterior and anterior lip of the cervix.  Silver nitrate used at the tenaculum site for hemostasis.  All instruments were removed from the vagina.  All instrument, sponge, and needle counts were correct x3.  The patient tolerated the procedure well.  She did not receive antibiotics prior to the procedure.     Jola Schmidt, MD     EBV/MEDQ  D:  12/29/2016  T:  12/29/2016  Job:  [id number]

## 2016-12-29 NOTE — Discharge Instructions (Signed)

## 2016-12-29 NOTE — Op Note (Deleted)
  The note originally documented on this encounter has been moved the the encounter in which it belongs.  

## 2016-12-30 NOTE — Anesthesia Postprocedure Evaluation (Addendum)
Anesthesia Post Note  Patient: Chelle Cayton  Procedure(s) Performed: Procedure(s) (LRB): DILATATION & CURETTAGE/HYSTEROSCOPY WITH HYDROTHERMAL ABLATION (N/A) MYOSURE with hysteroscopic myomectomy  (N/A)  Patient location during evaluation: PACU Anesthesia Type: General Level of consciousness: awake and alert Pain management: pain level controlled Vital Signs Assessment: post-procedure vital signs reviewed and stable Respiratory status: spontaneous breathing, nonlabored ventilation, respiratory function stable and patient connected to nasal cannula oxygen Cardiovascular status: blood pressure returned to baseline and stable Postop Assessment: no signs of nausea or vomiting Anesthetic complications: no       Last Vitals:  Vitals:   12/29/16 1400 12/29/16 1545  BP:  108/68  Pulse: 69 79  Resp: 11 16  Temp:  36.4 C    Last Pain:  Vitals:   12/29/16 1545  TempSrc:   PainSc: 3                  Kaytelynn Scripter

## 2016-12-31 ENCOUNTER — Encounter (HOSPITAL_BASED_OUTPATIENT_CLINIC_OR_DEPARTMENT_OTHER): Payer: Self-pay | Admitting: Obstetrics and Gynecology

## 2017-02-21 NOTE — Addendum Note (Signed)
Addendum  created 02/21/17 1342 by Oleta Mouse, MD   Sign clinical note

## 2017-03-28 ENCOUNTER — Ambulatory Visit: Payer: BLUE CROSS/BLUE SHIELD | Admitting: Skilled Nursing Facility1

## 2017-05-12 ENCOUNTER — Encounter: Payer: BLUE CROSS/BLUE SHIELD | Attending: General Surgery | Admitting: Skilled Nursing Facility1

## 2017-05-12 DIAGNOSIS — E669 Obesity, unspecified: Secondary | ICD-10-CM | POA: Diagnosis not present

## 2017-05-12 DIAGNOSIS — Z713 Dietary counseling and surveillance: Secondary | ICD-10-CM | POA: Insufficient documentation

## 2017-05-12 NOTE — Progress Notes (Signed)
  Follow-up visit: 6 Months Post-Operative RYGB Surgery  Medical Nutrition Therapy:  Appt start time: 1000 end time:  1025  Primary concerns today: Post-operative Bariatric Surgery Nutrition Management.  Pt states she still is suffering from back pain. Pt states she is wanting to snack more. Pt states her arthritis is causing her too much pain to be physically active. Pt states she forgets to add protein so she drinks protein shake. Pt states she is not happy with her weight or her sagging skin. Pt states she is going to talk with her surgeon about getting her extra skin removed due to her severe back and hip pain.  Surgery date: 03/29/2016 Surgery type: RYGB Start weight at [facility]: 382 lbs on 12/03/2015 Weight today: 226 lbs   Weight change: 20 lbs   TANITA  BODY COMP RESULTS  02/23/16 04/13/16 05/25/16 07/20/16 09/28/16 12/21/2016 05/12/2017   BMI (kg/m^2) 57.3 51.8 48.6 44.9 41.1 37.4 35.5   Fat Mass (lbs) 224.4 204.6 184.4 163.6 139.4 121.2 102.8   Fat Free Mass (lbs) 152.2 136.4 135.2 131.8 130.6 124.8 123.6   Total Body Water (lbs) N/A N/A 101.6 98.2 96.4 91.2 89.6   Preferred Learning Style:   No preference indicated   Learning Readiness:   Ready  24-hr recall: B (AM): Premier shake (30 g) sometimes an egg Snk (AM): none  L (PM):  small salad with meat (7-30 g) Snk (PM): protein shake (30g) D (PM): 2 oz pork chops with steamed vegetables or stir fry  (14 g) Snk (PM): none or sugar free popsicle  Has 1.5 - 2 protein shakes per day  Fluid intake: 68 oz of more water with lemon, 22 oz protein shake (135 ounces) Estimated total protein intake: 60-67 g   Medications:  Supplementation: bariatric advantage multi and calcium   Using straws: No Drinking while eating: No - caught herself before Hair loss: yes  Carbonated beverages: No N/V/D/C: constipation (2 days) Dumping syndrome: No  Recent physical activity:  Not able to exercise due to pain  Progress Towards Goal(s):  In  progress.  Handouts given during visit include:  none   Nutritional Diagnosis:  [place]-3.3 Overweight/obesity related to past poor dietary habits and physical inactivity as evidenced by patient w/ recent RYGB surgery following dietary guidelines for continued weight loss.   Intervention:  Nutrition education/diet advancement. Goals: -Try a piece of fruit every day -Try kefir in the yogurt aisle  -1200 calories: 90 grams protein; 135 grams carbohydrate, 33 grams Fat  Teaching Method Utilized:  Visual Auditory Hands on  Barriers to learning/adherence to lifestyle change: none  Demonstrated degree of understanding via:  Teach Back   Monitoring/Evaluation:  Dietary intake, exercise, and body weight.

## 2017-05-12 NOTE — Patient Instructions (Addendum)
-  Try a piece of fruit every day  -Try kefir in the yogurt aisle   -1200 calories: 90 grams protein; 135 grams carbohydrate, 33 grams Fat

## 2017-05-31 ENCOUNTER — Ambulatory Visit (INDEPENDENT_AMBULATORY_CARE_PROVIDER_SITE_OTHER): Payer: BLUE CROSS/BLUE SHIELD | Admitting: Internal Medicine

## 2017-05-31 ENCOUNTER — Other Ambulatory Visit: Payer: Self-pay | Admitting: Obstetrics and Gynecology

## 2017-05-31 ENCOUNTER — Encounter: Payer: Self-pay | Admitting: Internal Medicine

## 2017-05-31 VITALS — BP 120/76 | HR 77 | Ht 68.0 in | Wt 217.8 lb

## 2017-05-31 DIAGNOSIS — G4733 Obstructive sleep apnea (adult) (pediatric): Secondary | ICD-10-CM | POA: Diagnosis not present

## 2017-05-31 NOTE — Assessment & Plan Note (Signed)
She has accomplished significant weight loss after gastric bypass surgery and is very happy about this. For now she seems committed to maintaining this progress. We discussed benefit for control of her sleep apnea.

## 2017-05-31 NOTE — Patient Instructions (Signed)
Order- unattended home sleep test    dx OSA  Please call me about 2 weeks after the sleep test, to ask for results and recommendations.  Please call as needed

## 2017-05-31 NOTE — Progress Notes (Signed)
05/31/17-51 year old female never smoker followed for OSA. History of morbid obesity,  bariatric surgery with 70 pound weight loss since 2017. Unattended HST 01/19/16-AHI 31/hour, desaturation to 85%, body weight 370 pounds CPAP auto 5-15/[facility] (ResMed S 10 machine) FOLLOWS FOR: Pt had weight loss surgery and had to stop using CPAP due to fighting with pressures. DME was [facility]. Pt can tell she needs machine back, but has had break in therapy. She is appropriately proud of weight loss accomplished after gastric bypass surgery. Initially felt CPAP pressure was too high although was auto 5-15. She has been off CPAP since April. Now again experiencing morning headaches and noting more frequent waking from sleep.  Some nasal congestion with history of perennial allergic rhinitis treated with Xyzal and Flonase. No recognized parasomnias. Childhood ENT surgery-tonsils and adenoids.  ROS-see HPI   Negative unless "+" Constitutional:    weight loss, night sweats, fevers, chills, fatigue, lassitude. HEENT:    headaches, difficulty swallowing, tooth/dental problems, sore throat,       sneezing, itching, ear ache, nasal congestion, post nasal drip, snoring CV:    chest pain, orthopnea, PND, swelling in lower extremities, anasarca,                                                      dizziness, palpitations Resp:   shortness of breath with exertion or at rest.                productive cough,   non-productive cough, coughing up of blood.              change in color of mucus.  wheezing.   Skin:    rash or lesions. GI:  No-   heartburn, indigestion, abdominal pain, nausea, vomiting, diarrhea,                 change in bowel habits, loss of appetite GU: dysuria, change in color of urine, no urgency or frequency.   flank pain. MS:   joint pain, stiffness, decreased range of motion, back pain. Neuro-     nothing unusual Psych:  change in mood or affect.  depression or anxiety.   memory loss.  OBJ- Physical  Exam General- Alert, Oriented, Affect-appropriate, Distress- none acute Skin- rash-none, lesions- none, excoriation- none Lymphadenopathy- none Head- atraumatic            Eyes- Gross vision intact, PERRLA, conjunctivae and secretions clear            Ears- Hearing, canals-normal            Nose- + sniffing but Clear, no-Septal dev, mucus, polyps, erosion, perforation             Throat- Mallampati III , mucosa clear , drainage- none, tonsils- atrophic Neck- flexible , trachea midline, no stridor , thyroid nl, carotid no bruit Chest - symmetrical excursion , unlabored           Heart/CV- RRR , no murmur , no gallop  , no rub, nl s1 s2                           - JVD- none , edema- none, stasis changes- none, varices- none           Lung- clear to P&A, wheeze- none,  cough- none , dullness-none, rub- none           Chest wall-  Abd-  Br/ Gen/ Rectal- Not done, not indicated Extrem- cyanosis- none, clubbing, none, atrophy- none, strength- nl Neuro- grossly intact to observation

## 2017-05-31 NOTE — Assessment & Plan Note (Signed)
She has had a break in therapy so we are going to need to update sleep study to establish diagnosis, especially after surgical weight loss. She says she tosses and turns a lot and sleep normally and we discussed that if she is very mild, she might be a good candidate for an oral appliance instead of restarting CPAP. We will make that decision after sleep study. Plan-schedule sleep study.

## 2017-06-09 DIAGNOSIS — G4733 Obstructive sleep apnea (adult) (pediatric): Secondary | ICD-10-CM | POA: Diagnosis not present

## 2017-06-10 ENCOUNTER — Other Ambulatory Visit: Payer: Self-pay | Admitting: *Deleted

## 2017-06-10 DIAGNOSIS — G4733 Obstructive sleep apnea (adult) (pediatric): Secondary | ICD-10-CM | POA: Diagnosis not present

## 2017-06-21 ENCOUNTER — Telehealth: Payer: Self-pay | Admitting: Internal Medicine

## 2017-06-21 NOTE — Telephone Encounter (Signed)
Spoke with pt, she states she saw the sleep study on mychart but did not understand what was going on. She states nobody called for her results. CY please advise.

## 2017-06-22 NOTE — Telephone Encounter (Signed)
Her new sleep study showed mild obstructive apnea with an average of 11.5 apneas per hour. This is a big improvement from her score of 31 apneas per hour before her bariatric surgery. Since she is symptomatic now, I would suggest we order DME (used [facility] before) CPAP auto 5- 15, mask of choice, supplies, humidifier, AirView, for dx OSA.

## 2017-06-22 NOTE — Telephone Encounter (Signed)
Spoke with pt and notified of results per Dr. Melvyn Novas. Pt verbalized understanding and denied any questions. She is currently at the setting that CDY suggests, so she states no need to send to [facility] Nothing further needed

## 2017-08-30 ENCOUNTER — Ambulatory Visit: Payer: BLUE CROSS/BLUE SHIELD | Admitting: Internal Medicine

## 2017-11-15 ENCOUNTER — Ambulatory Visit: Payer: BLUE CROSS/BLUE SHIELD | Admitting: Skilled Nursing Facility1

## 2017-12-12 ENCOUNTER — Ambulatory Visit: Payer: BLUE CROSS/BLUE SHIELD | Admitting: Skilled Nursing Facility1

## 2017-12-14 ENCOUNTER — Ambulatory Visit: Payer: BLUE CROSS/BLUE SHIELD | Admitting: Internal Medicine

## 2018-02-15 ENCOUNTER — Other Ambulatory Visit (HOSPITAL_COMMUNITY)
Admission: RE | Admit: 2018-02-15 | Discharge: 2018-02-15 | Disposition: A | Discharge status: Home / Self Care | Payer: BLUE CROSS/BLUE SHIELD | Source: Ambulatory Visit | Attending: Obstetrics and Gynecology | Admitting: Obstetrics and Gynecology

## 2018-02-15 ENCOUNTER — Other Ambulatory Visit: Payer: Self-pay | Admitting: Obstetrics and Gynecology

## 2018-02-15 DIAGNOSIS — Z01419 Encounter for gynecological examination (general) (routine) without abnormal findings: Secondary | ICD-10-CM | POA: Diagnosis not present

## 2018-02-16 ENCOUNTER — Encounter (HOSPITAL_BASED_OUTPATIENT_CLINIC_OR_DEPARTMENT_OTHER): Payer: Self-pay | Admitting: *Deleted

## 2018-02-16 LAB — CYTOLOGY - PAP
Adequacy: ABSENT
DIAGNOSIS: NEGATIVE

## 2018-02-17 ENCOUNTER — Ambulatory Visit: Payer: Self-pay | Admitting: Plastic Surgery

## 2018-02-17 DIAGNOSIS — N6489 Other specified disorders of breast: Secondary | ICD-10-CM

## 2018-02-17 DIAGNOSIS — S21002A Unspecified open wound of left breast, initial encounter: Secondary | ICD-10-CM

## 2018-02-17 NOTE — H&P (Signed)
Kathryn Washington is an 52 y.o. female.   Chief Complaint: left breast wound HPI: Kathryn Washington is a 52 yo female here for post operative follow up following bilateral breast reduction surgery on 10/19/17.  15 weeks post op: URI much improved.  Left breast wound persists. She reports some increased drainage and slight bleeding from the area, but no odor, or fever or chills.    Past Medical History:  Diagnosis Date  . Chronic low back pain   . Complication of anesthesia    pt states when had colonscopy and hysterscopy awaken during procedure   . Headache    hx of migraines  . History of cystitis   . History of hepatitis C    treated w/ harvoni-- now cured  . History of recurrent UTIs   . 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 Hypertension   . Non-alcoholic fatty liver disease   . OA (osteoarthritis)    lumbar  . OSA on CPAP    severe osa per study 05/ 01/ 2017  . PONV (postoperative nausea and vomiting)   . Uterine fibroid   . Wears glasses     Past Surgical History:  Procedure Laterality Date  . ADENOIDECTOMY    . APPENDECTOMY    . DILATATION & CURETTAGE/HYSTEROSCOPY WITH MYOSURE N/A 12/29/2016   Procedure: Jacklynn Barnacle with hysteroscopic myomectomy ;  Surgeon: Thurnell Lose, MD;  Location: [facility];  Service: Gynecology;  Laterality: N/A;  . DILITATION & CURRETTAGE/HYSTROSCOPY WITH HYDROTHERMAL ABLATION N/A 12/29/2016   Procedure: DILATATION & CURETTAGE/HYSTEROSCOPY WITH HYDROTHERMAL ABLATION;  Surgeon: Thurnell Lose, MD;  Location: [place];  Service: Gynecology;  Laterality: N/A;  . GASTRIC ROUX-EN-Y N/A 03/29/2016   Procedure: LAPAROSCOPIC ROUX-EN-Y GASTRIC BYPASS WITH UPPER ENDOSCOPY;  Surgeon: Greer Pickerel, MD;  Location: [facility];  Service: General;  Laterality: N/A;  . hysterscopy     . LAMINECTOMY AND MICRODISCECTOMY LUMBAR SPINE  12/02/2003   L4 -- L5  . LAPAROSCOPIC CHOLECYSTECTOMY    . TONSILLECTOMY    . TYMPANOSTOMY  TUBE PLACEMENT Bilateral     Family History  Problem Relation Age of Onset  . Hypertension Mother   . Hyperlipidemia Mother   . Diabetes Mother   . Heart attack Mother   . Kidney disease Mother   . Cancer Father   . GER disease Father   . Hypertension Father   . Hyperlipidemia Father   . Arrhythmia Father   . Other Brother        DDD   Social History:  reports that she has never smoked. She has never used smokeless tobacco. She reports that she does not drink alcohol or use drugs.  Allergies:  Allergies  Allergen Reactions  . Coconut Fatty Acids Hives    No medications prior to admission.    No results found for this or any previous visit (from the past 48 hour(s)). No results found.  Review of Systems  Constitutional: Negative.   HENT: Negative.   Eyes: Negative.   Respiratory: Negative.   Cardiovascular: Negative.   Gastrointestinal: Negative.   Genitourinary: Negative.   Musculoskeletal: Negative.   Skin: Negative.   Neurological: Negative.   Psychiatric/Behavioral: Negative.     Height 5\' 8"  (1.727 m), weight 105.7 kg (233 lb), last menstrual period 02/16/2018. Physical Exam  Constitutional: She is oriented to person, place, and time. She appears well-developed and well-nourished.  HENT:  Head: Normocephalic and atraumatic.  Eyes: Pupils are  equal, round, and reactive to light. EOM are normal.  Cardiovascular: Normal rate.  Respiratory: Effort normal.  GI: Soft.  Neurological: She is alert and oriented to person, place, and time.  Skin: Skin is warm.  Psychiatric: She has a normal mood and affect. Her behavior is normal. Judgment and thought content normal.     Assessment/Plan    Plan for excision of left breast wound with closure of wound in the OR.   [place], DO 02/17/2018, 2:51 PM

## 2018-02-20 ENCOUNTER — Other Ambulatory Visit: Payer: Self-pay

## 2018-02-20 ENCOUNTER — Encounter (HOSPITAL_BASED_OUTPATIENT_CLINIC_OR_DEPARTMENT_OTHER): Payer: Self-pay | Admitting: *Deleted

## 2018-02-20 ENCOUNTER — Encounter (HOSPITAL_BASED_OUTPATIENT_CLINIC_OR_DEPARTMENT_OTHER)
Admission: RE | Admit: 2018-02-20 | Discharge: 2018-02-20 | Disposition: A | Discharge status: Home / Self Care | Payer: BLUE CROSS/BLUE SHIELD | Source: Ambulatory Visit | Attending: Plastic Surgery | Admitting: Plastic Surgery

## 2018-02-20 DIAGNOSIS — Z8619 Personal history of other infectious and parasitic diseases: Secondary | ICD-10-CM | POA: Diagnosis not present

## 2018-02-20 DIAGNOSIS — I10 Essential (primary) hypertension: Secondary | ICD-10-CM | POA: Insufficient documentation

## 2018-02-20 DIAGNOSIS — N651 Disproportion of reconstructed breast: Secondary | ICD-10-CM | POA: Diagnosis present

## 2018-02-20 DIAGNOSIS — K760 Fatty (change of) liver, not elsewhere classified: Secondary | ICD-10-CM | POA: Diagnosis not present

## 2018-02-20 DIAGNOSIS — Z9884 Bariatric surgery status: Secondary | ICD-10-CM | POA: Diagnosis not present

## 2018-02-20 DIAGNOSIS — Z9102 Food additives allergy status: Secondary | ICD-10-CM | POA: Diagnosis not present

## 2018-02-20 DIAGNOSIS — Z8249 Family history of ischemic heart disease and other diseases of the circulatory system: Secondary | ICD-10-CM | POA: Diagnosis not present

## 2018-02-20 DIAGNOSIS — M1990 Unspecified osteoarthritis, unspecified site: Secondary | ICD-10-CM | POA: Diagnosis not present

## 2018-02-20 DIAGNOSIS — G43909 Migraine, unspecified, not intractable, without status migrainosus: Secondary | ICD-10-CM | POA: Diagnosis not present

## 2018-02-20 DIAGNOSIS — E039 Hypothyroidism, unspecified: Secondary | ICD-10-CM | POA: Diagnosis not present

## 2018-02-20 DIAGNOSIS — Z841 Family history of disorders of kidney and ureter: Secondary | ICD-10-CM | POA: Diagnosis not present

## 2018-02-20 DIAGNOSIS — Z87440 Personal history of urinary (tract) infections: Secondary | ICD-10-CM | POA: Diagnosis not present

## 2018-02-20 DIAGNOSIS — G8929 Other chronic pain: Secondary | ICD-10-CM | POA: Diagnosis not present

## 2018-02-20 DIAGNOSIS — Z8269 Family history of other diseases of the musculoskeletal system and connective tissue: Secondary | ICD-10-CM | POA: Diagnosis not present

## 2018-02-20 DIAGNOSIS — G4733 Obstructive sleep apnea (adult) (pediatric): Secondary | ICD-10-CM | POA: Diagnosis not present

## 2018-02-20 DIAGNOSIS — Z833 Family history of diabetes mellitus: Secondary | ICD-10-CM | POA: Diagnosis not present

## 2018-02-20 DIAGNOSIS — Z809 Family history of malignant neoplasm, unspecified: Secondary | ICD-10-CM | POA: Diagnosis not present

## 2018-02-20 DIAGNOSIS — M549 Dorsalgia, unspecified: Secondary | ICD-10-CM | POA: Diagnosis not present

## 2018-02-20 DIAGNOSIS — Z9049 Acquired absence of other specified parts of digestive tract: Secondary | ICD-10-CM | POA: Diagnosis not present

## 2018-02-20 LAB — COMPREHENSIVE METABOLIC PANEL
ALK PHOS: 77 U/L (ref 38–126)
ALT: 18 U/L (ref 14–54)
ANION GAP: 8 (ref 5–15)
AST: 20 U/L (ref 15–41)
Albumin: 4.1 g/dL (ref 3.5–5.0)
BILIRUBIN TOTAL: 0.5 mg/dL (ref 0.3–1.2)
BUN: 19 mg/dL (ref 6–20)
CALCIUM: 9.5 mg/dL (ref 8.9–10.3)
CO2: 29 mmol/L (ref 22–32)
Chloride: 101 mmol/L (ref 101–111)
Creatinine, Ser: 0.76 mg/dL (ref 0.44–1.00)
GFR calc non Af Amer: >60 mL/min (ref >60)
Glucose, Bld: 71 mg/dL (ref 65–99)
POTASSIUM: 3.8 mmol/L (ref 3.5–5.1)
Sodium: 138 mmol/L (ref 135–145)
TOTAL PROTEIN: 6.6 g/dL (ref 6.5–8.1)

## 2018-02-21 ENCOUNTER — Ambulatory Visit (HOSPITAL_BASED_OUTPATIENT_CLINIC_OR_DEPARTMENT_OTHER): Payer: BLUE CROSS/BLUE SHIELD | Admitting: Anesthesiology

## 2018-02-21 ENCOUNTER — Ambulatory Visit (HOSPITAL_BASED_OUTPATIENT_CLINIC_OR_DEPARTMENT_OTHER)
Admission: RE | Admit: 2018-02-21 | Discharge: 2018-02-21 | Disposition: A | Discharge status: Home / Self Care | Payer: BLUE CROSS/BLUE SHIELD | Source: Ambulatory Visit | Attending: Plastic Surgery | Admitting: Plastic Surgery

## 2018-02-21 ENCOUNTER — Other Ambulatory Visit: Payer: Self-pay

## 2018-02-21 ENCOUNTER — Encounter (HOSPITAL_BASED_OUTPATIENT_CLINIC_OR_DEPARTMENT_OTHER): Payer: Self-pay | Admitting: Anesthesiology

## 2018-02-21 ENCOUNTER — Encounter (HOSPITAL_BASED_OUTPATIENT_CLINIC_OR_DEPARTMENT_OTHER)
Admission: RE | Disposition: A | Discharge status: Home / Self Care | Payer: Self-pay | Source: Ambulatory Visit | Attending: Plastic Surgery

## 2018-02-21 DIAGNOSIS — G43909 Migraine, unspecified, not intractable, without status migrainosus: Secondary | ICD-10-CM | POA: Insufficient documentation

## 2018-02-21 DIAGNOSIS — G8929 Other chronic pain: Secondary | ICD-10-CM | POA: Insufficient documentation

## 2018-02-21 DIAGNOSIS — Z841 Family history of disorders of kidney and ureter: Secondary | ICD-10-CM | POA: Insufficient documentation

## 2018-02-21 DIAGNOSIS — N6489 Other specified disorders of breast: Secondary | ICD-10-CM

## 2018-02-21 DIAGNOSIS — N651 Disproportion of reconstructed breast: Secondary | ICD-10-CM | POA: Diagnosis not present

## 2018-02-21 DIAGNOSIS — M1990 Unspecified osteoarthritis, unspecified site: Secondary | ICD-10-CM | POA: Insufficient documentation

## 2018-02-21 DIAGNOSIS — Z8619 Personal history of other infectious and parasitic diseases: Secondary | ICD-10-CM | POA: Insufficient documentation

## 2018-02-21 DIAGNOSIS — M549 Dorsalgia, unspecified: Secondary | ICD-10-CM | POA: Insufficient documentation

## 2018-02-21 DIAGNOSIS — E039 Hypothyroidism, unspecified: Secondary | ICD-10-CM | POA: Insufficient documentation

## 2018-02-21 DIAGNOSIS — Z87440 Personal history of urinary (tract) infections: Secondary | ICD-10-CM | POA: Insufficient documentation

## 2018-02-21 DIAGNOSIS — Z8269 Family history of other diseases of the musculoskeletal system and connective tissue: Secondary | ICD-10-CM | POA: Insufficient documentation

## 2018-02-21 DIAGNOSIS — Z9049 Acquired absence of other specified parts of digestive tract: Secondary | ICD-10-CM | POA: Insufficient documentation

## 2018-02-21 DIAGNOSIS — Z833 Family history of diabetes mellitus: Secondary | ICD-10-CM | POA: Insufficient documentation

## 2018-02-21 DIAGNOSIS — Z8249 Family history of ischemic heart disease and other diseases of the circulatory system: Secondary | ICD-10-CM | POA: Insufficient documentation

## 2018-02-21 DIAGNOSIS — I10 Essential (primary) hypertension: Secondary | ICD-10-CM | POA: Insufficient documentation

## 2018-02-21 DIAGNOSIS — Z9884 Bariatric surgery status: Secondary | ICD-10-CM | POA: Insufficient documentation

## 2018-02-21 DIAGNOSIS — G4733 Obstructive sleep apnea (adult) (pediatric): Secondary | ICD-10-CM | POA: Insufficient documentation

## 2018-02-21 DIAGNOSIS — S21002A Unspecified open wound of left breast, initial encounter: Secondary | ICD-10-CM

## 2018-02-21 DIAGNOSIS — Z9102 Food additives allergy status: Secondary | ICD-10-CM | POA: Insufficient documentation

## 2018-02-21 DIAGNOSIS — K760 Fatty (change of) liver, not elsewhere classified: Secondary | ICD-10-CM | POA: Insufficient documentation

## 2018-02-21 DIAGNOSIS — Z809 Family history of malignant neoplasm, unspecified: Secondary | ICD-10-CM | POA: Insufficient documentation

## 2018-02-21 HISTORY — PX: LIPOSUCTION: SHX10

## 2018-02-21 HISTORY — PX: DEBRIDEMENT AND CLOSURE WOUND: SHX5614

## 2018-02-21 SURGERY — DEBRIDEMENT, WOUND, WITH CLOSURE
Anesthesia: General | Site: Breast | Laterality: Left

## 2018-02-21 MED ORDER — LIDOCAINE-EPINEPHRINE (PF) 1 %-1:200000 IJ SOLN
INTRAMUSCULAR | Status: DC | PRN
  Administered 2018-02-21: 12 mL

## 2018-02-21 MED ORDER — EPINEPHRINE 30 MG/30ML IJ SOLN
INTRAMUSCULAR | Status: AC
  Filled 2018-02-21: qty 1

## 2018-02-21 MED ORDER — DEXAMETHASONE SODIUM PHOSPHATE 10 MG/ML IJ SOLN
INTRAMUSCULAR | Status: AC
  Filled 2018-02-21: qty 1

## 2018-02-21 MED ORDER — SODIUM CHLORIDE 0.9% FLUSH: 3.0000 mL | Freq: Two times a day (BID) | INTRAVENOUS | Status: DC

## 2018-02-21 MED ORDER — FENTANYL CITRATE (PF) 100 MCG/2ML IJ SOLN
INTRAMUSCULAR | Status: AC
  Filled 2018-02-21: qty 2

## 2018-02-21 MED ORDER — LIDOCAINE HCL (PF) 1 % IJ SOLN
INTRAMUSCULAR | Status: AC
  Filled 2018-02-21: qty 30

## 2018-02-21 MED ORDER — FENTANYL CITRATE (PF) 100 MCG/2ML IJ SOLN
50.0000 ug | INTRAMUSCULAR | Status: DC | PRN
  Administered 2018-02-21: 100 ug via INTRAVENOUS

## 2018-02-21 MED ORDER — ONDANSETRON HCL 4 MG/2ML IJ SOLN
INTRAMUSCULAR | Status: AC
  Filled 2018-02-21: qty 2

## 2018-02-21 MED ORDER — SODIUM CHLORIDE 0.9% FLUSH: 3.0000 mL | INTRAVENOUS | Status: DC | PRN

## 2018-02-21 MED ORDER — LIDOCAINE-EPINEPHRINE (PF) 1 %-1:200000 IJ SOLN
INTRAMUSCULAR | Status: AC
  Filled 2018-02-21: qty 30

## 2018-02-21 MED ORDER — SODIUM CHLORIDE 0.9 % IV SOLN
INTRAVENOUS | Status: DC | PRN
  Administered 2018-02-21: 500 mL

## 2018-02-21 MED ORDER — MIDAZOLAM HCL 2 MG/2ML IJ SOLN
1.0000 mg | INTRAMUSCULAR | Status: DC | PRN
  Administered 2018-02-21: 2 mg via INTRAVENOUS

## 2018-02-21 MED ORDER — SODIUM CHLORIDE 0.9 % IV SOLN: 250.0000 mL | INTRAVENOUS | Status: DC | PRN

## 2018-02-21 MED ORDER — LIDOCAINE HCL 1 % IJ SOLN
INTRAVENOUS | Status: DC | PRN
  Administered 2018-02-21: 1000 mL

## 2018-02-21 MED ORDER — LIDOCAINE 2% (20 MG/ML) 5 ML SYRINGE
INTRAMUSCULAR | Status: DC | PRN
  Administered 2018-02-21: 40 mg via INTRAVENOUS

## 2018-02-21 MED ORDER — MIDAZOLAM HCL 2 MG/2ML IJ SOLN
INTRAMUSCULAR | Status: AC
  Filled 2018-02-21: qty 2

## 2018-02-21 MED ORDER — ONDANSETRON HCL 4 MG/2ML IJ SOLN: 4.0000 mg | Freq: Once | INTRAMUSCULAR | Status: DC | PRN

## 2018-02-21 MED ORDER — CEFAZOLIN SODIUM-DEXTROSE 2-4 GM/100ML-% IV SOLN
INTRAVENOUS | Status: AC
  Filled 2018-02-21: qty 100

## 2018-02-21 MED ORDER — SCOPOLAMINE 1 MG/3DAYS TD PT72: 1.0000 | MEDICATED_PATCH | Freq: Once | TRANSDERMAL | Status: DC | PRN

## 2018-02-21 MED ORDER — CEFAZOLIN SODIUM-DEXTROSE 2-4 GM/100ML-% IV SOLN
2.0000 g | INTRAVENOUS | Status: AC
  Administered 2018-02-21: 2 g via INTRAVENOUS

## 2018-02-21 MED ORDER — OXYCODONE HCL 5 MG PO TABS: 5.0000 mg | ORAL_TABLET | ORAL | Status: DC | PRN

## 2018-02-21 MED ORDER — BUPIVACAINE-EPINEPHRINE 0.25% -1:200000 IJ SOLN: INTRAMUSCULAR | Status: DC | PRN

## 2018-02-21 MED ORDER — ONDANSETRON HCL 4 MG/2ML IJ SOLN
INTRAMUSCULAR | Status: DC | PRN
  Administered 2018-02-21: 4 mg via INTRAVENOUS

## 2018-02-21 MED ORDER — LACTATED RINGERS IV SOLN
INTRAVENOUS | Status: DC
  Administered 2018-02-21: 07:00:00 via INTRAVENOUS

## 2018-02-21 MED ORDER — DEXAMETHASONE SODIUM PHOSPHATE 4 MG/ML IJ SOLN
INTRAMUSCULAR | Status: DC | PRN
  Administered 2018-02-21: 10 mg via INTRAVENOUS

## 2018-02-21 MED ORDER — PROPOFOL 10 MG/ML IV BOLUS
INTRAVENOUS | Status: AC
  Filled 2018-02-21: qty 20

## 2018-02-21 MED ORDER — PROPOFOL 10 MG/ML IV BOLUS
INTRAVENOUS | Status: DC | PRN
  Administered 2018-02-21: 200 mg via INTRAVENOUS

## 2018-02-21 MED ORDER — FENTANYL CITRATE (PF) 100 MCG/2ML IJ SOLN
25.0000 ug | INTRAMUSCULAR | Status: DC | PRN
  Administered 2018-02-21: 25 ug via INTRAVENOUS

## 2018-02-21 SURGICAL SUPPLY — 82 items
ADH SKN CLS APL DERMABOND .7 (GAUZE/BANDAGES/DRESSINGS) ×2
BAG DECANTER FOR FLEXI CONT (MISCELLANEOUS) ×4 IMPLANT
BINDER ABDOMINAL  9 SM 30-45 (SOFTGOODS)
BINDER ABDOMINAL 10 UNV 27-48 (MISCELLANEOUS) IMPLANT
BINDER ABDOMINAL 12 ML 46-62 (SOFTGOODS) IMPLANT
BINDER ABDOMINAL 9 SM 30-45 (SOFTGOODS) IMPLANT
BINDER BREAST 3XL (GAUZE/BANDAGES/DRESSINGS) IMPLANT
BINDER BREAST LRG (GAUZE/BANDAGES/DRESSINGS) IMPLANT
BINDER BREAST MEDIUM (GAUZE/BANDAGES/DRESSINGS) IMPLANT
BINDER BREAST XLRG (GAUZE/BANDAGES/DRESSINGS) IMPLANT
BINDER BREAST XXLRG (GAUZE/BANDAGES/DRESSINGS) ×2 IMPLANT
BLADE CLIPPER SURG (BLADE) IMPLANT
BLADE HEX COATED 2.75 (ELECTRODE) ×4 IMPLANT
BLADE SURG 10 STRL SS (BLADE) IMPLANT
BLADE SURG 15 STRL LF DISP TIS (BLADE) ×2 IMPLANT
BLADE SURG 15 STRL SS (BLADE) ×4
BNDG GAUZE ELAST 4 BULKY (GAUZE/BANDAGES/DRESSINGS) ×8 IMPLANT
CANISTER SUCT 1200ML W/VALVE (MISCELLANEOUS) ×4 IMPLANT
CHLORAPREP W/TINT 26ML (MISCELLANEOUS) ×4 IMPLANT
COVER BACK TABLE 60X90IN (DRAPES) ×4 IMPLANT
COVER MAYO STAND STRL (DRAPES) ×4 IMPLANT
DECANTER SPIKE VIAL GLASS SM (MISCELLANEOUS) IMPLANT
DERMABOND ADVANCED (GAUZE/BANDAGES/DRESSINGS) ×2
DERMABOND ADVANCED .7 DNX12 (GAUZE/BANDAGES/DRESSINGS) IMPLANT
DRAIN CHANNEL 19F RND (DRAIN) IMPLANT
DRAPE INCISE IOBAN 66X45 STRL (DRAPES) IMPLANT
DRAPE LAPAROSCOPIC ABDOMINAL (DRAPES) ×4 IMPLANT
DRAPE LAPAROTOMY 100X72 PEDS (DRAPES) IMPLANT
DRAPE SURG 17X23 STRL (DRAPES) IMPLANT
DRAPE U-SHAPE 76X120 STRL (DRAPES) IMPLANT
DRESSING DUODERM 4X4 STERILE (GAUZE/BANDAGES/DRESSINGS) IMPLANT
DRSG ADAPTIC 3X8 NADH LF (GAUZE/BANDAGES/DRESSINGS) IMPLANT
DRSG EMULSION OIL 3X3 NADH (GAUZE/BANDAGES/DRESSINGS) IMPLANT
DRSG PAD ABDOMINAL 8X10 ST (GAUZE/BANDAGES/DRESSINGS) ×10 IMPLANT
DRSG TEGADERM 4X10 (GAUZE/BANDAGES/DRESSINGS) IMPLANT
DRSG TELFA 3X8 NADH (GAUZE/BANDAGES/DRESSINGS) IMPLANT
ELECT BLADE 4.0 EZ CLEAN MEGAD (MISCELLANEOUS)
ELECT REM PT RETURN 9FT ADLT (ELECTROSURGICAL) ×4
ELECTRODE BLDE 4.0 EZ CLN MEGD (MISCELLANEOUS) IMPLANT
ELECTRODE REM PT RTRN 9FT ADLT (ELECTROSURGICAL) ×2 IMPLANT
EVACUATOR SILICONE 100CC (DRAIN) IMPLANT
GAUZE SPONGE 4X4 12PLY STRL (GAUZE/BANDAGES/DRESSINGS) IMPLANT
GAUZE XEROFORM 5X9 LF (GAUZE/BANDAGES/DRESSINGS) IMPLANT
GLOVE BIO SURGEON STRL SZ 6.5 (GLOVE) ×6 IMPLANT
GLOVE BIO SURGEONS STRL SZ 6.5 (GLOVE) ×2
GOWN STRL REUS W/ TWL LRG LVL3 (GOWN DISPOSABLE) ×4 IMPLANT
GOWN STRL REUS W/TWL LRG LVL3 (GOWN DISPOSABLE) ×8
LINER CANISTER 1000CC FLEX (MISCELLANEOUS) ×4 IMPLANT
NDL HYPO 25X1 1.5 SAFETY (NEEDLE) ×2 IMPLANT
NDL SAFETY ECLIPSE 18X1.5 (NEEDLE) ×2 IMPLANT
NEEDLE HYPO 18GX1.5 SHARP (NEEDLE) ×4
NEEDLE HYPO 25X1 1.5 SAFETY (NEEDLE) ×4 IMPLANT
NS IRRIG 1000ML POUR BTL (IV SOLUTION) ×4 IMPLANT
PACK BASIN DAY SURGERY FS (CUSTOM PROCEDURE TRAY) ×4 IMPLANT
PAD ALCOHOL SWAB (MISCELLANEOUS) ×4 IMPLANT
PAD DRESSING TELFA 3X8 NADH (GAUZE/BANDAGES/DRESSINGS) IMPLANT
PENCIL BUTTON HOLSTER BLD 10FT (ELECTRODE) ×4 IMPLANT
SHEET MEDIUM DRAPE 40X70 STRL (DRAPES) IMPLANT
SLEEVE SCD COMPRESS KNEE MED (MISCELLANEOUS) ×4 IMPLANT
SPONGE LAP 18X18 RF (DISPOSABLE) ×4 IMPLANT
STAPLER VISISTAT 35W (STAPLE) IMPLANT
SUT MNCRL AB 3-0 PS2 18 (SUTURE) IMPLANT
SUT MNCRL AB 4-0 PS2 18 (SUTURE) IMPLANT
SUT MON AB 5-0 PS2 18 (SUTURE) ×8 IMPLANT
SUT SILK 3 0 PS 1 (SUTURE) IMPLANT
SUT VIC AB 3-0 SH 27 (SUTURE)
SUT VIC AB 3-0 SH 27X BRD (SUTURE) IMPLANT
SUT VICRYL 4-0 PS2 18IN ABS (SUTURE) IMPLANT
SWAB COLLECTION DEVICE MRSA (MISCELLANEOUS) IMPLANT
SWAB CULTURE ESWAB REG 1ML (MISCELLANEOUS) IMPLANT
SYR 50ML LL SCALE MARK (SYRINGE) ×4 IMPLANT
SYR BULB IRRIGATION 50ML (SYRINGE) ×4 IMPLANT
SYR CONTROL 10ML LL (SYRINGE) ×4 IMPLANT
SYR TB 1ML LL NO SAFETY (SYRINGE) ×4 IMPLANT
TOWEL GREEN STERILE FF (TOWEL DISPOSABLE) ×8 IMPLANT
TRAY DSU PREP LF (CUSTOM PROCEDURE TRAY) ×4 IMPLANT
TUBE CONNECTING 20'X1/4 (TUBING) ×1
TUBE CONNECTING 20X1/4 (TUBING) ×3 IMPLANT
TUBING INFILTRATION IT-10001 (TUBING) IMPLANT
TUBING SET GRADUATE ASPIR 12FT (MISCELLANEOUS) ×4 IMPLANT
UNDERPAD 30X30 (UNDERPADS AND DIAPERS) ×8 IMPLANT
YANKAUER SUCT BULB TIP NO VENT (SUCTIONS) ×4 IMPLANT

## 2018-02-21 NOTE — Transfer of Care (Signed)
Immediate Anesthesia Transfer of Care Note  Patient: Kathryn Washington  Procedure(s) Performed: Excision of left breast wound with primary closure (Left Breast) LIPOSUCTION TO LATERAL BREAST (Bilateral Breast)  Patient Location: PACU  Anesthesia Type:General  Level of Consciousness: sedated  Airway & Oxygen Therapy: Patient Spontanous Breathing and Patient connected to face mask oxygen  Post-op Assessment: Report given to RN and Post -op Vital signs reviewed and stable  Post vital signs: Reviewed and stable  Last Vitals:  Vitals Value Taken Time  BP 139/84 02/21/2018  8:45 AM  Temp    Pulse 87 02/21/2018  8:46 AM  Resp 16 02/21/2018  8:46 AM  SpO2 100 % 02/21/2018  8:46 AM  Vitals shown include unvalidated device data.  Last Pain:  Vitals:   02/21/18 0705  TempSrc: Oral         Complications: No apparent anesthesia complications

## 2018-02-21 NOTE — Discharge Instructions (Addendum)
May shower tomorrow Continue binder or sports bra No heavy lifting    Call your surgeon if you experience:   1.  Fever over 101.0. 2.  Inability to urinate. 3.  Nausea and/or vomiting. 4.  Extreme swelling or bruising at the surgical site. 5.  Continued bleeding from the incision. 6.  Increased pain, redness or drainage from the incision. 7.  Problems related to your pain medication. 8.  Any problems and/or concerns    Post Anesthesia Home Care Instructions  Activity: Get plenty of rest for the remainder of the day. A responsible individual must stay with you for 24 hours following the procedure.  For the next 24 hours, DO NOT: -Drive a car -Paediatric nurse -Drink alcoholic beverages -Take any medication unless instructed by your physician -Make any legal decisions or sign important papers.  Meals: Start with liquid foods such as gelatin or soup. Progress to regular foods as tolerated. Avoid greasy, spicy, heavy foods. If nausea and/or vomiting occur, drink only clear liquids until the nausea and/or vomiting subsides. Call your physician if vomiting continues.  Special Instructions/Symptoms: Your throat may feel dry or sore from the anesthesia or the breathing tube placed in your throat during surgery. If this causes discomfort, gargle with warm salt water. The discomfort should disappear within 24 hours.  If you had a scopolamine patch placed behind your ear for the management of post- operative nausea and/or vomiting:  1. The medication in the patch is effective for 72 hours, after which it should be removed.  Wrap patch in a tissue and discard in the trash. Wash hands thoroughly with soap and water. 2. You may remove the patch earlier than 72 hours if you experience unpleasant side effects which may include dry mouth, dizziness or visual disturbances. 3. Avoid touching the patch. Wash your hands with soap and water after contact with the patch.

## 2018-02-21 NOTE — Op Note (Addendum)
DATE OF OPERATION: 02/21/2018  LOCATION: [facility]  PREOPERATIVE DIAGNOSIS: left breast wound and lateral breast aymmetry  POSTOPERATIVE DIAGNOSIS: Same  PROCEDURE: Excision of 3 x 6 cm left breast wound skin, scar and soft tissue with lateral breast liposuction for symmetry  SURGEON: Tannon Peerson Sanger Lenaya Pietsch, DO  EBL: 2 cc  CONDITION: Stable  COMPLICATIONS: None  INDICATION: The patient, Kathryn Washington, is a 52 y.o. female born on Nov 23, 1965, is here for treatment of a left breast wound.   PROCEDURE DETAILS:  The patient was seen prior to surgery and marked.  The IV antibiotics were given. The patient was taken to the operating room and given a general anesthetic. A standard time out was performed and all information was confirmed by those in the room. SCDs were placed.   The breasts were prepped and draped in the usual sterile fashion.  Local was injected in the left breast and lateral breasts bilaterally.  Tumescent was placed laterally on both breast.  The #10 blade was used to excise the 3 x 6 cm wound on the vertical limb area of the breast. The excision included skin, scar and soft tissue.  Undermining was done medially and laterally to help decrease the tension of closure.  The deep layers were closed with 3-0 Monocryl followed by 4-0 and 5-0 Monocryl.  Liposuction was done laterally on both sides to help improve symmetry with 150 cc removed.  Dermabond and steri strips were applied to the left breast.  The patient was allowed to wake up and taken to recovery room in stable condition at the end of the case. The family was notified at the end of the case.

## 2018-02-21 NOTE — Interval H&P Note (Signed)
History and Physical Interval Note:  02/21/2018 7:10 AM  Kathryn Washington  has presented today for surgery, with the diagnosis of left breast wound  The various methods of treatment have been discussed with the patient and family. After consideration of risks, benefits and other options for treatment, the patient has consented to  Procedure(s): Excision of left breast wound with primary closure (Left) LIPOSUCTION TO LATERAL BREAST (Bilateral) as a surgical intervention .  The patient's history has been reviewed, patient examined, no change in status, stable for surgery.  I have reviewed the patient's chart and labs.  Questions were answered to the patient's satisfaction.     Loel Lofty Donnice Nielsen

## 2018-02-21 NOTE — Anesthesia Procedure Notes (Signed)
Procedure Name: LMA Insertion Date/Time: 02/21/2018 7:38 AM Performed by: Maryella Shivers, CRNA Pre-anesthesia Checklist: Patient identified, Emergency Drugs available, Suction available and Patient being monitored Patient Re-evaluated:Patient Re-evaluated prior to induction Oxygen Delivery Method: Circle system utilized Preoxygenation: Pre-oxygenation with 100% oxygen Induction Type: IV induction Ventilation: Mask ventilation without difficulty LMA: LMA inserted LMA Size: 4.0 Number of attempts: 1 Airway Equipment and Method: Bite block Placement Confirmation: positive ETCO2 Tube secured with: Tape Dental Injury: Teeth and Oropharynx as per pre-operative assessment

## 2018-02-21 NOTE — Anesthesia Postprocedure Evaluation (Signed)
Anesthesia Post Note  Patient: Kathryn Washington  Procedure(s) Performed: Excision of left breast wound with primary closure (Left Breast) LIPOSUCTION TO LATERAL BREAST (Bilateral Breast)     Patient location during evaluation: PACU Anesthesia Type: General Level of consciousness: awake and alert Pain management: pain level controlled Vital Signs Assessment: post-procedure vital signs reviewed and stable Respiratory status: spontaneous breathing, nonlabored ventilation and respiratory function stable Cardiovascular status: blood pressure returned to baseline and stable Postop Assessment: no apparent nausea or vomiting Anesthetic complications: no    Last Vitals:  Vitals:   02/21/18 0930 02/21/18 1010  BP: 111/62 123/72  Pulse: 70 69  Resp: (!) 9 18  Temp:  36.6 C  SpO2: 100% 100%    Last Pain:  Vitals:   02/21/18 1010  TempSrc:   PainSc: [place]

## 2018-02-21 NOTE — Anesthesia Preprocedure Evaluation (Signed)
Anesthesia Evaluation  Patient identified by MRN, date of birth, ID band Patient awake    Reviewed: Allergy & Precautions, NPO status , Patient's Chart, lab work & pertinent test results  Airway Mallampati: II  TM Distance: >3 FB Neck ROM: Full    Dental  (+) Dental Advisory Given   Pulmonary sleep apnea and Continuous Positive Airway Pressure Ventilation ,    breath sounds clear to auscultation       Cardiovascular hypertension, Pt. on medications  Rhythm:Regular Rate:Normal     Neuro/Psych negative neurological ROS     GI/Hepatic GERD  ,(+) Hepatitis -Hx roux-en-y   Endo/Other  Hypothyroidism   Renal/GU negative Renal ROS     Musculoskeletal  (+) Arthritis ,   Abdominal   Peds  Hematology negative hematology ROS (+)   Anesthesia Other Findings   Reproductive/Obstetrics                             Lab Results  Component Value Date   WBC 7.8 12/29/2016   HGB 16.3 (H) 12/29/2016   HCT 48.0 (H) 12/29/2016   MCV 89.4 12/29/2016   PLT 296 12/29/2016   Lab Results  Component Value Date   CREATININE 0.76 02/20/2018   BUN 19 02/20/2018   NA 138 02/20/2018   K 3.8 02/20/2018   CL 101 02/20/2018   CO2 29 02/20/2018    Anesthesia Physical Anesthesia Plan  ASA: II  Anesthesia Plan: General   Post-op Pain Management:    Induction: Intravenous  PONV Risk Score and Plan: 3 and Midazolam, Dexamethasone, Ondansetron and Treatment may vary due to age or medical condition  Airway Management Planned: LMA  Additional Equipment:   Intra-op Plan:   Post-operative Plan: Extubation in OR  Informed Consent: I have reviewed the patients History and Physical, chart, labs and discussed the procedure including the risks, benefits and alternatives for the proposed anesthesia with the patient or authorized representative who has indicated his/her understanding and acceptance.   Dental  advisory given  Plan Discussed with: CRNA  Anesthesia Plan Comments:         Anesthesia Quick Evaluation

## 2018-02-22 ENCOUNTER — Encounter (HOSPITAL_BASED_OUTPATIENT_CLINIC_OR_DEPARTMENT_OTHER): Payer: Self-pay | Admitting: Plastic Surgery

## 2018-06-28 ENCOUNTER — Other Ambulatory Visit: Payer: Self-pay | Admitting: Physical Medicine and Rehabilitation

## 2018-06-28 DIAGNOSIS — M25512 Pain in left shoulder: Secondary | ICD-10-CM

## 2018-07-10 ENCOUNTER — Ambulatory Visit
Admission: RE | Admit: 2018-07-10 | Discharge: 2018-07-10 | Disposition: A | Discharge status: Home / Self Care | Payer: BLUE CROSS/BLUE SHIELD | Source: Ambulatory Visit | Attending: Physical Medicine and Rehabilitation | Admitting: Physical Medicine and Rehabilitation

## 2018-07-10 DIAGNOSIS — M25512 Pain in left shoulder: Secondary | ICD-10-CM

## 2018-07-17 ENCOUNTER — Other Ambulatory Visit: Payer: Self-pay | Admitting: Physical Medicine and Rehabilitation

## 2018-07-17 DIAGNOSIS — M7551 Bursitis of right shoulder: Secondary | ICD-10-CM

## 2018-07-23 ENCOUNTER — Ambulatory Visit
Admission: RE | Admit: 2018-07-23 | Discharge: 2018-07-23 | Disposition: A | Discharge status: Home / Self Care | Payer: BLUE CROSS/BLUE SHIELD | Source: Ambulatory Visit | Attending: Physical Medicine and Rehabilitation | Admitting: Physical Medicine and Rehabilitation

## 2018-07-23 DIAGNOSIS — M7551 Bursitis of right shoulder: Secondary | ICD-10-CM

## 2018-09-12 ENCOUNTER — Encounter: Payer: Self-pay | Admitting: Plastic Surgery

## 2018-09-12 ENCOUNTER — Ambulatory Visit: Payer: BLUE CROSS/BLUE SHIELD | Admitting: Plastic Surgery

## 2018-09-12 DIAGNOSIS — L723 Sebaceous cyst: Secondary | ICD-10-CM | POA: Diagnosis not present

## 2018-09-12 MED ORDER — CEPHALEXIN 500 MG PO CAPS: 500.0000 mg | ORAL_CAPSULE | Freq: Four times a day (QID) | ORAL | 0 refills | Status: AC

## 2018-09-12 NOTE — Progress Notes (Signed)
   Subjective:    Patient ID: Kathryn Washington, female    DOB: Jan 14, 1966, 52 y.o.   MRN: [id number]  Kathryn Washington is a 52 year old female here for lesion of a tender red area of her upper left abdominal area.  She had a little cyst there before that was drained.  The skin healed.  Over the last week it has gotten tender, red and much larger.  It is an area of 3 x 4 cm.  It is red and tender to touch.  It is localized.   Review of Systems  Constitutional: Negative.   HENT: Negative.   Eyes: Negative.   Respiratory: Negative.   Genitourinary: Negative.   Skin: Positive for color change and wound.       Objective:   Physical Exam Vitals signs and nursing note reviewed.  Constitutional:      Appearance: Normal appearance.  HENT:     Head: Normocephalic.  Cardiovascular:     Rate and Rhythm: Normal rate.  Abdominal:     General: Abdomen is flat. There is no distension.     Tenderness: There is abdominal tenderness.  Neurological:     Mental Status: She is alert.  Psychiatric:        Mood and Affect: Mood normal.        Thought Content: Thought content normal.        Judgment: Judgment normal.       Assessment & Plan:  Sebaceous cyst I will send in Keflex for her.  We will also plan on excising the sac of the cyst when the redness has either resolved or at least improved.  I also recommend a warm compress and ibuprofen as able.

## 2018-09-18 ENCOUNTER — Telehealth: Payer: Self-pay

## 2018-09-18 NOTE — Telephone Encounter (Signed)
Telephone call from patient: she reports that the "cyst" on her upper left abdomen has "ruptured"- she describes the drainage as "bloody, pus, with a green color". She c/o increased tenderness & red. Denies fever Patient has completed 5 days of Keflex as prescribed by Dr. Marla Roe, and is calling for further direction of care. I instructed pt. to continue warm compress & advil as directed by Dr. Marla Roe.  I will consult Dr. Marla Roe for next plan of care. Patient agrees with plan.  [employer]

## 2018-09-18 NOTE — Telephone Encounter (Signed)
Telephone call to pt to inform her that as per Dr. Marla Roe, she should try warm moist compresses & keep the area covered & dry.  Pt will call next week to give a f/u status. Pt agrees with plan  Doroteo Bradford

## 2020-07-23 ENCOUNTER — Emergency Department (HOSPITAL_COMMUNITY)
Admission: EM | Admit: 2020-07-23 | Discharge: 2020-07-23 | Disposition: A | Discharge status: Home / Self Care | Payer: BC Managed Care – PPO | Attending: Emergency Medicine | Admitting: Emergency Medicine

## 2020-07-23 ENCOUNTER — Emergency Department (HOSPITAL_COMMUNITY): Payer: BC Managed Care – PPO

## 2020-07-23 DIAGNOSIS — R1013 Epigastric pain: Secondary | ICD-10-CM | POA: Insufficient documentation

## 2020-07-23 DIAGNOSIS — R072 Precordial pain: Secondary | ICD-10-CM | POA: Diagnosis present

## 2020-07-23 DIAGNOSIS — R42 Dizziness and giddiness: Secondary | ICD-10-CM | POA: Insufficient documentation

## 2020-07-23 DIAGNOSIS — R202 Paresthesia of skin: Secondary | ICD-10-CM | POA: Diagnosis not present

## 2020-07-23 DIAGNOSIS — R079 Chest pain, unspecified: Secondary | ICD-10-CM

## 2020-07-23 DIAGNOSIS — I10 Essential (primary) hypertension: Secondary | ICD-10-CM | POA: Insufficient documentation

## 2020-07-23 LAB — CBC WITH DIFFERENTIAL/PLATELET
Abs Immature Granulocytes: 0.04 10*3/uL (ref 0.00–0.07)
Basophils Absolute: 0 10*3/uL (ref 0.0–0.1)
Basophils Relative: 0 %
Eosinophils Absolute: 0.1 10*3/uL (ref 0.0–0.5)
Eosinophils Relative: 1 %
HCT: 47.7 % — ABNORMAL HIGH (ref 36.0–46.0)
Hemoglobin: 15.8 g/dL — ABNORMAL HIGH (ref 12.0–15.0)
Immature Granulocytes: 0 %
Lymphocytes Relative: 16 %
Lymphs Abs: 1.9 10*3/uL (ref 0.7–4.0)
MCH: 30.9 pg (ref 26.0–34.0)
MCHC: 33.1 g/dL (ref 30.0–36.0)
MCV: 93.2 fL (ref 80.0–100.0)
Monocytes Absolute: 0.8 10*3/uL (ref 0.1–1.0)
Monocytes Relative: 6 %
Neutro Abs: 9.1 10*3/uL — ABNORMAL HIGH (ref 1.7–7.7)
Neutrophils Relative %: 77 %
Platelets: 248 10*3/uL (ref 150–400)
RBC: 5.12 MIL/uL — ABNORMAL HIGH (ref 3.87–5.11)
RDW: 12.3 % (ref 11.5–15.5)
WBC: 11.9 10*3/uL — ABNORMAL HIGH (ref 4.0–10.5)
nRBC: 0 % (ref 0.0–0.2)

## 2020-07-23 LAB — BASIC METABOLIC PANEL
Anion gap: 11 (ref 5–15)
BUN: 18 mg/dL (ref 6–20)
CO2: 28 mmol/L (ref 22–32)
Calcium: 9.3 mg/dL (ref 8.9–10.3)
Chloride: 99 mmol/L (ref 98–111)
Creatinine, Ser: 0.8 mg/dL (ref 0.44–1.00)
GFR, Estimated: >60 mL/min (ref >60)
Glucose, Bld: 118 mg/dL — ABNORMAL HIGH (ref 70–99)
Potassium: 3.6 mmol/L (ref 3.5–5.1)
Sodium: 138 mmol/L (ref 135–145)

## 2020-07-23 LAB — TROPONIN I (HIGH SENSITIVITY)
Troponin I (High Sensitivity): <2 ng/L (ref <18)
Troponin I (High Sensitivity): <2 ng/L (ref <18)

## 2020-07-23 MED ORDER — ALUM & MAG HYDROXIDE-SIMETH 200-200-20 MG/5ML PO SUSP
30.0000 mL | Freq: Once | ORAL | Status: DC
  Filled 2020-07-23: qty 30

## 2020-07-23 NOTE — ED Triage Notes (Signed)
Pt BIB EMS with co sudden chest pain thatywoke her out of sleep. Pt felt nauseous , dizzy and diaphoretic.  Denies hx of the same, denies cardiac hx , only PMH is hypertension  Per EMS , pt has chest pain rt bp being taken, during this time BP decreases, per EMS, pt also has a weak radial pulses     ASA 324mg  taken prior to EMS arrival     116/68 HR 70 RR18 RA 96

## 2020-07-23 NOTE — Discharge Instructions (Signed)
Try pepcid or tagamet up to twice a day.  Try to avoid things that may make this worse, most commonly these are spicy foods tomato based products fatty foods chocolate and peppermint.  Alcohol and tobacco can also make this worse.  Return to the emergency department for sudden worsening pain fever or inability to eat or drink.  

## 2020-07-23 NOTE — ED Provider Notes (Signed)
[facility] EMERGENCY DEPARTMENT Provider Note   CSN: [id number] Arrival date & time: 07/23/20  0737     History Chief Complaint  Patient presents with  . Chest Pain    Kathryn Washington is a 54 y.o. female.  54 yo F with a chief complaints of chest pain. This woke her from sleep. Described as a feeling like her chest is caving in. She felt like she was lightheaded when that occurred and started having tingling around her mouth and into both of her hands. Upon EMS arrival she was feeling lightheaded. Since then her pain is gotten significantly better. Seems that it is coming and going somewhat. No pain yesterday. No cough congestion or fever. No trauma.  Patient denies history of MI, denies hypertension hyperlipidemia diabetes or smoking.  Denies family history of MI.  Patient denies history of PE or DVT denies hemoptysis denies unilateral lower extremity edema denies recent surgery immobilization hospitalization estrogen use or history of cancer.   The history is provided by the patient.  Chest Pain Pain location:  Substernal area Pain quality: aching   Pain radiates to:  Does not radiate Pain severity:  Mild Onset quality:  Gradual Duration:  20 minutes Timing:  Intermittent Progression:  Waxing and waning Chronicity:  New Relieved by:  Nothing Worsened by:  Nothing Ineffective treatments:  None tried Associated symptoms: no dizziness, no fever, no headache, no nausea, no palpitations, no shortness of breath and no vomiting        Past Medical History:  Diagnosis Date  . Chronic low back pain   . Complication of anesthesia    pt states when had colonscopy and hysterscopy awaken during procedure   . Headache    hx of migraines  . History of cystitis   . History of hepatitis C    treated w/ harvoni-- now cured  . History of recurrent UTIs   . 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 Hypertension   . Non-alcoholic fatty liver  disease   . OA (osteoarthritis)    lumbar  . OSA on CPAP    severe osa per study 05/ 01/ 2017  . PONV (postoperative nausea and vomiting)   . Uterine fibroid   . Wears glasses     Patient Active Problem List   Diagnosis Date Noted  . Sebaceous cyst 09/12/2018  . Fatty liver 03/29/2016  . Prediabetes 03/29/2016  . S/P gastric bypass 03/29/2016  . OSA (obstructive sleep apnea) 12/29/2015  . Morbid obesity ([place]) 12/29/2015  . Exertional dyspnea 12/29/2015  . Acid reflux 02/05/2015  . 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 BP (high blood pressure) 02/05/2015  . HCV (hepatitis C virus) 02/05/2015    Past Surgical History:  Procedure Laterality Date  . ADENOIDECTOMY    . APPENDECTOMY    . DEBRIDEMENT AND CLOSURE WOUND Left 02/21/2018   Procedure: Excision of left breast wound with primary closure;  Surgeon: Wallace Going, DO;  Location: [facility];  Service: Plastics;  Laterality: Left;  . DILATATION & CURETTAGE/HYSTEROSCOPY WITH MYOSURE N/A 12/29/2016   Procedure: Jacklynn Barnacle with hysteroscopic myomectomy ;  Surgeon: Thurnell Lose, MD;  Location: [facility];  Service: Gynecology;  Laterality: N/A;  . DILITATION & CURRETTAGE/HYSTROSCOPY WITH HYDROTHERMAL ABLATION N/A 12/29/2016   Procedure: DILATATION & CURETTAGE/HYSTEROSCOPY WITH HYDROTHERMAL ABLATION;  Surgeon: Thurnell Lose, MD;  Location: [facility];  Service: Gynecology;  Laterality: N/A;  . GASTRIC ROUX-EN-Y N/A 03/29/2016  Procedure: LAPAROSCOPIC ROUX-EN-Y GASTRIC BYPASS WITH UPPER ENDOSCOPY;  Surgeon: Greer Pickerel, MD;  Location: [facility];  Service: General;  Laterality: N/A;  . hysterscopy     . LAMINECTOMY AND MICRODISCECTOMY LUMBAR SPINE  12/02/2003   L4 -- L5  . LAPAROSCOPIC CHOLECYSTECTOMY    . LIPOSUCTION Bilateral 02/21/2018   Procedure: LIPOSUCTION TO LATERAL BREAST;  Surgeon: Wallace Going, DO;  Location: [facility];  Service: Plastics;  Laterality:  Bilateral;  . TONSILLECTOMY    . TYMPANOSTOMY TUBE PLACEMENT Bilateral      OB History   No obstetric history on file.     Family History  Problem Relation Age of Onset  . Hypertension Mother   . Hyperlipidemia Mother   . Diabetes Mother   . Heart attack Mother   . Kidney disease Mother   . Cancer Father   . GER disease Father   . Hypertension Father   . Hyperlipidemia Father   . Arrhythmia Father   . Other Brother        DDD    Social History   Tobacco Use  . Smoking status: Never Smoker  . Smokeless tobacco: Never Used  Vaping Use  . Vaping Use: Never used  Substance Use Topics  . Alcohol use: No  . Drug use: No    Home Medications Prior to Admission medications   Medication Sig Start Date End Date Taking? Authorizing Provider  Ascorbic Acid (VITAMIN C) 1000 MG tablet Take 1,000 mg by mouth daily.    [provider]  B Complex-Biotin-FA (SUPER B-50 COMPLEX PO) Take by mouth.    [provider]  Biotin 10000 MCG TABS Take 10,000 mcg by mouth every morning.    [provider]  buPROPion (WELLBUTRIN XL) 150 MG 24 hr tablet Take 150 mg by mouth daily.    [provider]  Cholecalciferol (VITAMIN D3) 5000 units CAPS Take 2 capsules by mouth daily.    [provider]  cyanocobalamin 1000 MCG tablet Take 1,000 mcg by mouth every morning.    [provider]  fluticasone (FLONASE) 50 MCG/ACT nasal spray Place 2 sprays into both nostrils daily.     [provider]  levocetirizine (XYZAL) 5 MG tablet Take 5 mg by mouth every evening.    [provider]  liothyronine (CYTOMEL) 25 MCG tablet Take by mouth daily.    [provider]  losartan-hydrochlorothiazide (HYZAAR) 50-12.5 MG tablet Take 1 tablet by mouth at bedtime.    [provider]  methocarbamol (ROBAXIN) 500 MG tablet Take 1 tablet by mouth 2 (two) times daily. 05/19/15   [provider]  Multiple Vitamin (MULTIVITAMIN)  tablet Take 1 tablet by mouth daily.    [provider]  Nutritional Supplements (DHEA PO) Take by mouth.    [provider]  SUMAtriptan (IMITREX) 50 MG tablet Take 1 tablet by mouth every 2 (two) hours as needed for migraine.  12/25/15   [provider]    Allergies    Coconut fatty acids  Review of Systems   Review of Systems  Constitutional: Negative for chills and fever.  HENT: Negative for congestion and rhinorrhea.   Eyes: Negative for redness and visual disturbance.  Respiratory: Negative for shortness of breath and wheezing.   Cardiovascular: Positive for chest pain. Negative for palpitations.  Gastrointestinal: Negative for nausea and vomiting.  Genitourinary: Negative for dysuria and urgency.  Musculoskeletal: Negative for arthralgias and myalgias.  Skin: Negative for pallor and  wound.  Neurological: Positive for light-headedness. Negative for dizziness and headaches.    Physical Exam Updated Vital Signs BP 124/73 (BP Location: Right Arm)   Pulse 76   Temp 97.7 F (36.5 C) (Oral)   Resp (!) 21   Ht 5\' 8"  (1.727 m)   Wt 115.4 kg   LMP 12/17/2016   SpO2 97%   BMI 38.68 kg/m   Physical Exam Vitals and nursing note reviewed.  Constitutional:      General: She is not in acute distress.    Appearance: She is well-developed. She is obese. She is not diaphoretic.  HENT:     Head: Normocephalic and atraumatic.  Eyes:     Pupils: Pupils are equal, round, and reactive to light.  Cardiovascular:     Rate and Rhythm: Normal rate and regular rhythm.     Heart sounds: No murmur heard.  No friction rub. No gallop.   Pulmonary:     Effort: Pulmonary effort is normal.     Breath sounds: No wheezing or rales.  Chest:     Chest wall: No tenderness.  Abdominal:     General: There is no distension.     Palpations: Abdomen is soft.     Tenderness: There is no abdominal tenderness.     Comments: Mild epigastric discomfort  Musculoskeletal:         General: No tenderness.     Cervical back: Normal range of motion and neck supple.  Skin:    General: Skin is warm and dry.  Neurological:     Mental Status: She is alert and oriented to person, place, and time.  Psychiatric:        Behavior: Behavior normal.     ED Results / Procedures / Treatments   Labs (all labs ordered are listed, but only abnormal results are displayed) Labs Reviewed  BASIC METABOLIC PANEL - Abnormal; Notable for the following components:      Result Value   Glucose, Bld 118 (*)    All other components within normal limits  CBC WITH DIFFERENTIAL/PLATELET - Abnormal; Notable for the following components:   WBC 11.9 (*)    RBC 5.12 (*)    Hemoglobin 15.8 (*)    HCT 47.7 (*)    Neutro Abs 9.1 (*)    All other components within normal limits  TROPONIN I (HIGH SENSITIVITY)  TROPONIN I (HIGH SENSITIVITY)    EKG EKG Interpretation  Date/Time:  Wednesday July 23 2020 03:23:51 EDT Ventricular Rate:  77 PR Interval:    QRS Duration: 93 QT Interval:  402 QTC Calculation: 455 R Axis:   63 Text Interpretation: Sinus rhythm Low voltage, precordial leads Consider anterior infarct No significant change since last tracing Confirmed by Deno Etienne [phone]) on 07/23/2020 3:30:15 AM   Radiology DG Chest Port 1 View  Result Date: 07/23/2020 CLINICAL DATA:  Chest pain EXAM: PORTABLE CHEST 1 VIEW COMPARISON:  02/12/2016 FINDINGS: The heart size and mediastinal contours are within normal limits. Both lungs are clear. The visualized skeletal structures are unremarkable. IMPRESSION: Normal study. Electronically Signed   By: Rolm Baptise M.D.   On: 07/23/2020 03:54    Procedures Procedures (including critical care time)  Medications Ordered in ED Medications  alum & mag hydroxide-simeth (MAALOX/MYLANTA) 200-200-20 MG/5ML suspension 30 mL (30 mLs Oral Refused 07/23/20 0417)    ED Course  I have reviewed the triage vital signs and the nursing notes.  Pertinent  labs & imaging results that were available  during my care of the patient were reviewed by me and considered in my medical decision making (see chart for details).    MDM Rules/Calculators/A&P                          54 yo F with a chief complaint of chest pain. This woke her up from sleep this evening. Has gotten somewhat better but seems to come and go. Will obtain a laboratory evaluation chest x-ray delta troponin. I feel her symptoms are completely atypical of pulmonary embolism and will not obtain further testing.  Delta negative.  D/c home.   7:07 AM:  I have discussed the diagnosis/risks/treatment options with the patient and believe the pt to be eligible for discharge home to follow-up with PCP. We also discussed returning to the ED immediately if new or worsening sx occur. We discussed the sx which are most concerning (e.g., sudden worsening pain, fever, inability to tolerate by mouth) that necessitate immediate return. Medications administered to the patient during their visit and any new prescriptions provided to the patient are listed below.  Medications given during this visit Medications  alum & mag hydroxide-simeth (MAALOX/MYLANTA) 200-200-20 MG/5ML suspension 30 mL (30 mLs Oral Refused 07/23/20 0417)     The patient appears reasonably screen and/or stabilized for discharge and I doubt any other medical condition or other [facility] requiring further screening, evaluation, or treatment in the ED at this time prior to discharge.   Final Clinical Impression(s) / ED Diagnoses Final diagnoses:  Nonspecific chest pain    Rx / DC Orders ED Discharge Orders    None       Deno Etienne, DO 07/23/20 1683

## 2020-12-25 DIAGNOSIS — I10 Essential (primary) hypertension: Secondary | ICD-10-CM | POA: Diagnosis not present

## 2020-12-25 DIAGNOSIS — M4802 Spinal stenosis, cervical region: Secondary | ICD-10-CM | POA: Diagnosis not present

## 2020-12-25 DIAGNOSIS — Z0000 Encounter for general adult medical examination without abnormal findings: Secondary | ICD-10-CM | POA: Diagnosis not present

## 2020-12-25 DIAGNOSIS — M47812 Spondylosis without myelopathy or radiculopathy, cervical region: Secondary | ICD-10-CM | POA: Diagnosis not present

## 2020-12-29 DIAGNOSIS — M5030 Other cervical disc degeneration, unspecified cervical region: Secondary | ICD-10-CM | POA: Diagnosis not present

## 2020-12-29 DIAGNOSIS — F418 Other specified anxiety disorders: Secondary | ICD-10-CM | POA: Diagnosis not present

## 2020-12-29 DIAGNOSIS — M545 Low back pain, unspecified: Secondary | ICD-10-CM | POA: Diagnosis not present

## 2020-12-29 DIAGNOSIS — Z0000 Encounter for general adult medical examination without abnormal findings: Secondary | ICD-10-CM | POA: Diagnosis not present

## 2021-01-06 DIAGNOSIS — M4696 Unspecified inflammatory spondylopathy, lumbar region: Secondary | ICD-10-CM | POA: Diagnosis not present

## 2021-02-18 DIAGNOSIS — G8929 Other chronic pain: Secondary | ICD-10-CM | POA: Diagnosis not present

## 2021-02-18 DIAGNOSIS — R195 Other fecal abnormalities: Secondary | ICD-10-CM | POA: Diagnosis not present

## 2021-02-18 DIAGNOSIS — Z6837 Body mass index (BMI) 37.0-37.9, adult: Secondary | ICD-10-CM | POA: Diagnosis not present

## 2021-02-18 DIAGNOSIS — M545 Low back pain, unspecified: Secondary | ICD-10-CM | POA: Diagnosis not present

## 2021-02-26 DIAGNOSIS — Z1231 Encounter for screening mammogram for malignant neoplasm of breast: Secondary | ICD-10-CM | POA: Diagnosis not present

## 2021-03-17 DIAGNOSIS — Z01419 Encounter for gynecological examination (general) (routine) without abnormal findings: Secondary | ICD-10-CM | POA: Diagnosis not present

## 2021-03-27 DIAGNOSIS — R0989 Other specified symptoms and signs involving the circulatory and respiratory systems: Secondary | ICD-10-CM | POA: Diagnosis not present

## 2021-03-27 DIAGNOSIS — B349 Viral infection, unspecified: Secondary | ICD-10-CM | POA: Diagnosis not present

## 2021-03-27 DIAGNOSIS — Z20822 Contact with and (suspected) exposure to covid-19: Secondary | ICD-10-CM | POA: Diagnosis not present

## 2021-03-27 DIAGNOSIS — Z9229 Personal history of other drug therapy: Secondary | ICD-10-CM | POA: Diagnosis not present

## 2021-03-27 DIAGNOSIS — R059 Cough, unspecified: Secondary | ICD-10-CM | POA: Diagnosis not present

## 2021-04-01 DIAGNOSIS — M47812 Spondylosis without myelopathy or radiculopathy, cervical region: Secondary | ICD-10-CM | POA: Diagnosis not present

## 2021-04-01 DIAGNOSIS — I10 Essential (primary) hypertension: Secondary | ICD-10-CM | POA: Diagnosis not present

## 2021-04-01 DIAGNOSIS — Z6838 Body mass index (BMI) 38.0-38.9, adult: Secondary | ICD-10-CM | POA: Diagnosis not present

## 2021-04-01 DIAGNOSIS — M542 Cervicalgia: Secondary | ICD-10-CM | POA: Diagnosis not present

## 2021-05-04 DIAGNOSIS — M47812 Spondylosis without myelopathy or radiculopathy, cervical region: Secondary | ICD-10-CM | POA: Diagnosis not present

## 2021-05-28 DIAGNOSIS — M47812 Spondylosis without myelopathy or radiculopathy, cervical region: Secondary | ICD-10-CM | POA: Diagnosis not present

## 2021-06-16 DIAGNOSIS — U071 COVID-19: Secondary | ICD-10-CM | POA: Diagnosis not present

## 2021-06-16 DIAGNOSIS — J0110 Acute frontal sinusitis, unspecified: Secondary | ICD-10-CM | POA: Diagnosis not present

## 2021-06-25 DIAGNOSIS — M542 Cervicalgia: Secondary | ICD-10-CM | POA: Diagnosis not present

## 2021-06-25 DIAGNOSIS — M5030 Other cervical disc degeneration, unspecified cervical region: Secondary | ICD-10-CM | POA: Diagnosis not present

## 2021-06-25 DIAGNOSIS — M47812 Spondylosis without myelopathy or radiculopathy, cervical region: Secondary | ICD-10-CM | POA: Diagnosis not present

## 2021-06-25 DIAGNOSIS — M4802 Spinal stenosis, cervical region: Secondary | ICD-10-CM | POA: Diagnosis not present

## 2021-09-24 DIAGNOSIS — M47812 Spondylosis without myelopathy or radiculopathy, cervical region: Secondary | ICD-10-CM | POA: Diagnosis not present

## 2021-10-20 DIAGNOSIS — M47812 Spondylosis without myelopathy or radiculopathy, cervical region: Secondary | ICD-10-CM | POA: Diagnosis not present

## 2021-10-29 DIAGNOSIS — J0110 Acute frontal sinusitis, unspecified: Secondary | ICD-10-CM | POA: Diagnosis not present

## 2021-11-05 DIAGNOSIS — J209 Acute bronchitis, unspecified: Secondary | ICD-10-CM | POA: Diagnosis not present

## 2021-11-16 DIAGNOSIS — R194 Change in bowel habit: Secondary | ICD-10-CM | POA: Diagnosis not present

## 2021-11-16 DIAGNOSIS — R140 Abdominal distension (gaseous): Secondary | ICD-10-CM | POA: Diagnosis not present

## 2021-11-16 DIAGNOSIS — R1084 Generalized abdominal pain: Secondary | ICD-10-CM | POA: Diagnosis not present

## 2021-11-17 ENCOUNTER — Ambulatory Visit
Admission: RE | Admit: 2021-11-17 | Discharge: 2021-11-17 | Disposition: A | Discharge status: Home / Self Care | Payer: BLUE CROSS/BLUE SHIELD | Source: Ambulatory Visit | Attending: Gastroenterology | Admitting: Gastroenterology

## 2021-11-17 ENCOUNTER — Other Ambulatory Visit: Payer: Self-pay | Admitting: Gastroenterology

## 2021-11-17 DIAGNOSIS — R140 Abdominal distension (gaseous): Secondary | ICD-10-CM

## 2021-11-17 DIAGNOSIS — R109 Unspecified abdominal pain: Secondary | ICD-10-CM | POA: Diagnosis not present

## 2021-11-17 DIAGNOSIS — R1084 Generalized abdominal pain: Secondary | ICD-10-CM

## 2021-11-17 DIAGNOSIS — K5900 Constipation, unspecified: Secondary | ICD-10-CM | POA: Diagnosis not present

## 2021-11-17 DIAGNOSIS — R194 Change in bowel habit: Secondary | ICD-10-CM

## 2021-11-23 DIAGNOSIS — M47812 Spondylosis without myelopathy or radiculopathy, cervical region: Secondary | ICD-10-CM | POA: Diagnosis not present

## 2021-12-15 DIAGNOSIS — M47812 Spondylosis without myelopathy or radiculopathy, cervical region: Secondary | ICD-10-CM | POA: Diagnosis not present

## 2021-12-23 DIAGNOSIS — Z0000 Encounter for general adult medical examination without abnormal findings: Secondary | ICD-10-CM | POA: Diagnosis not present

## 2021-12-23 DIAGNOSIS — I10 Essential (primary) hypertension: Secondary | ICD-10-CM | POA: Diagnosis not present

## 2021-12-23 DIAGNOSIS — Z6837 Body mass index (BMI) 37.0-37.9, adult: Secondary | ICD-10-CM | POA: Diagnosis not present

## 2022-01-01 DIAGNOSIS — M545 Low back pain, unspecified: Secondary | ICD-10-CM | POA: Diagnosis not present

## 2022-01-01 DIAGNOSIS — G8929 Other chronic pain: Secondary | ICD-10-CM | POA: Diagnosis not present

## 2022-01-01 DIAGNOSIS — I10 Essential (primary) hypertension: Secondary | ICD-10-CM | POA: Diagnosis not present

## 2022-01-01 DIAGNOSIS — F3341 Major depressive disorder, recurrent, in partial remission: Secondary | ICD-10-CM | POA: Diagnosis not present

## 2022-01-01 DIAGNOSIS — Z23 Encounter for immunization: Secondary | ICD-10-CM | POA: Diagnosis not present

## 2022-01-01 DIAGNOSIS — Z0000 Encounter for general adult medical examination without abnormal findings: Secondary | ICD-10-CM | POA: Diagnosis not present

## 2022-01-27 DIAGNOSIS — M47812 Spondylosis without myelopathy or radiculopathy, cervical region: Secondary | ICD-10-CM | POA: Diagnosis not present

## 2022-03-04 DIAGNOSIS — Z1231 Encounter for screening mammogram for malignant neoplasm of breast: Secondary | ICD-10-CM | POA: Diagnosis not present

## 2022-03-17 DIAGNOSIS — Z01419 Encounter for gynecological examination (general) (routine) without abnormal findings: Secondary | ICD-10-CM | POA: Diagnosis not present

## 2022-09-05 IMAGING — CR DG ABDOMEN 1V
2 series · 2 of 2 positions shown · non-contrast
Comparison: November 10, 2012, report only.

CLINICAL DATA: Abdominal pain and bloating.

EXAM:
ABDOMEN - 1 VIEW

[t abdomen supine (1 of 2)]
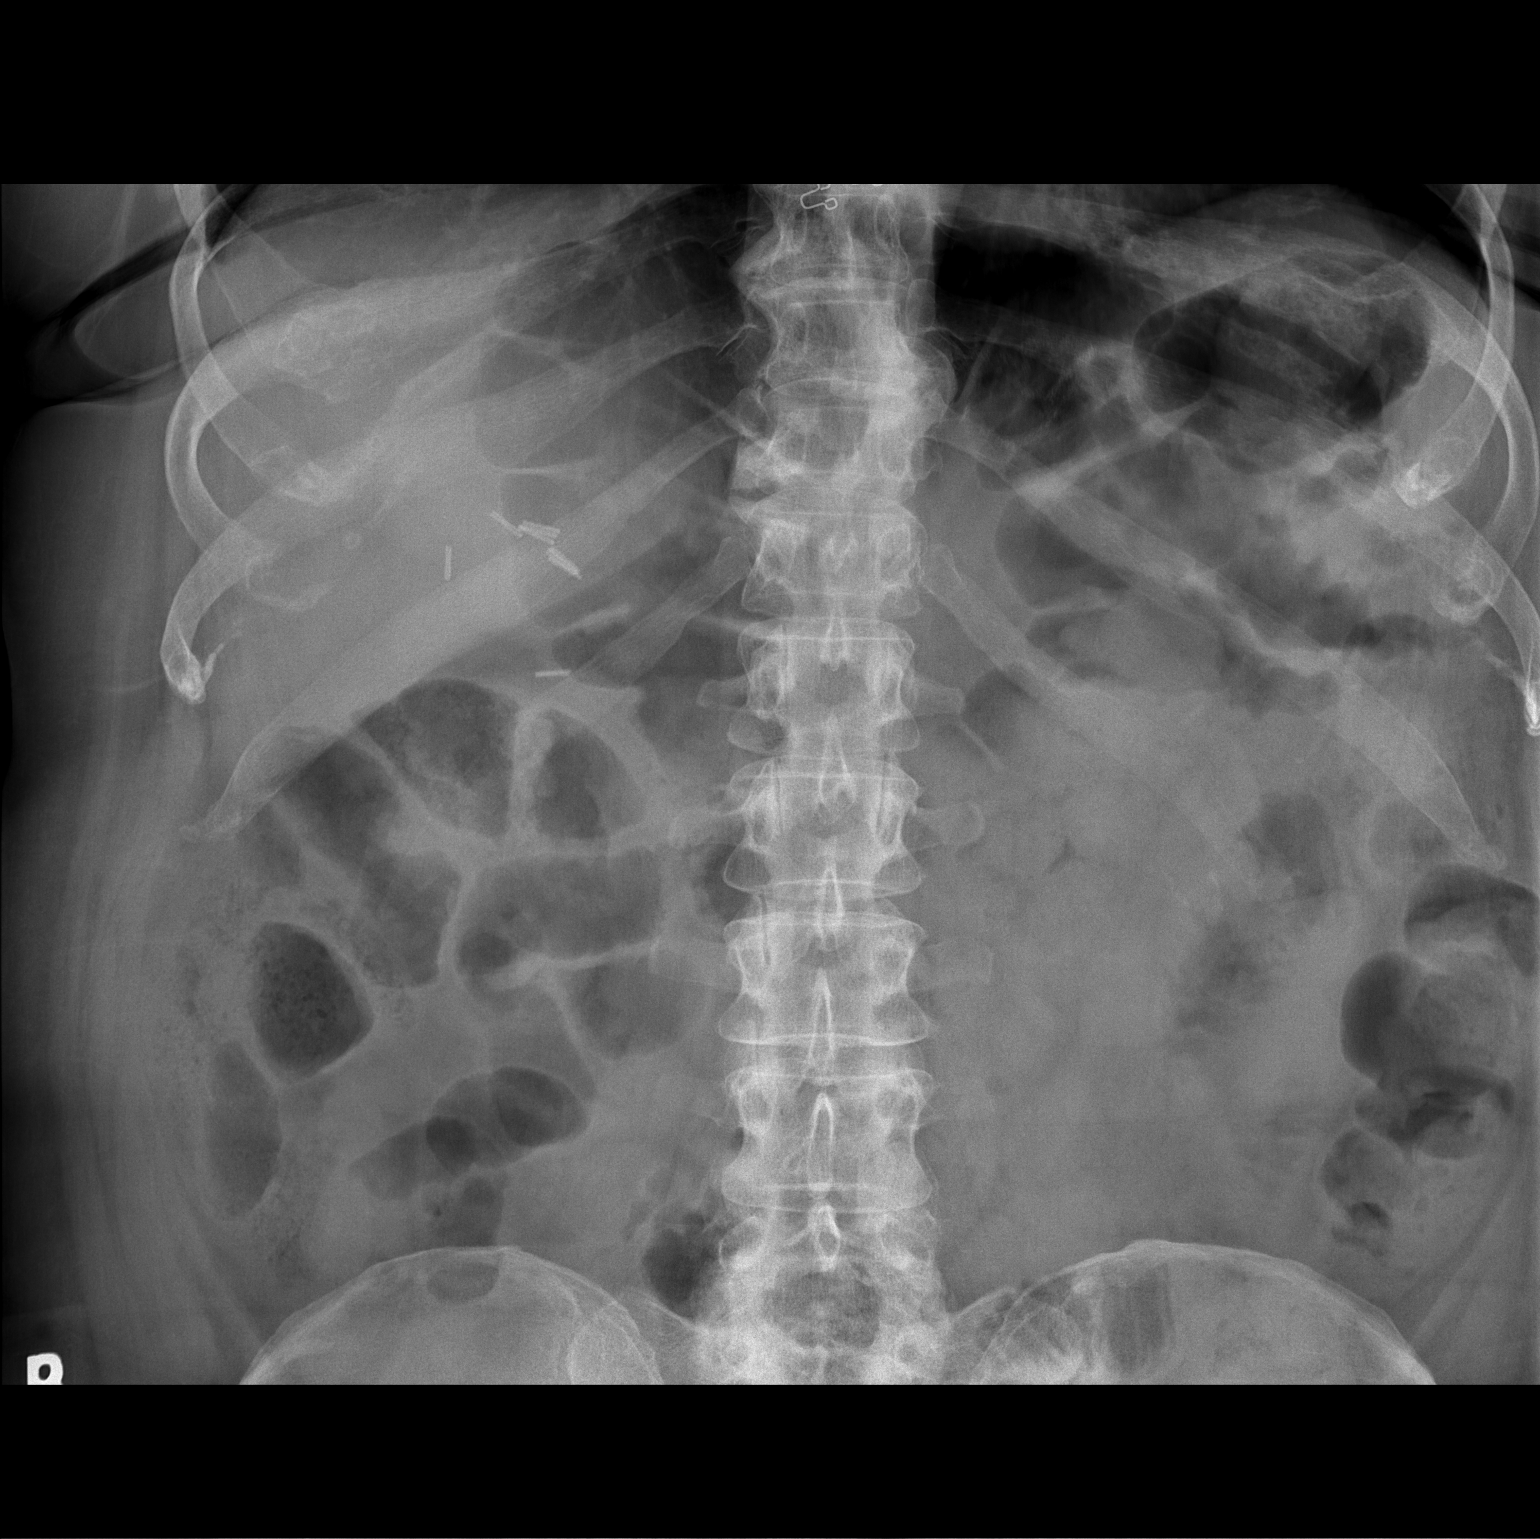

[t abdomen supine (2 of 2)]
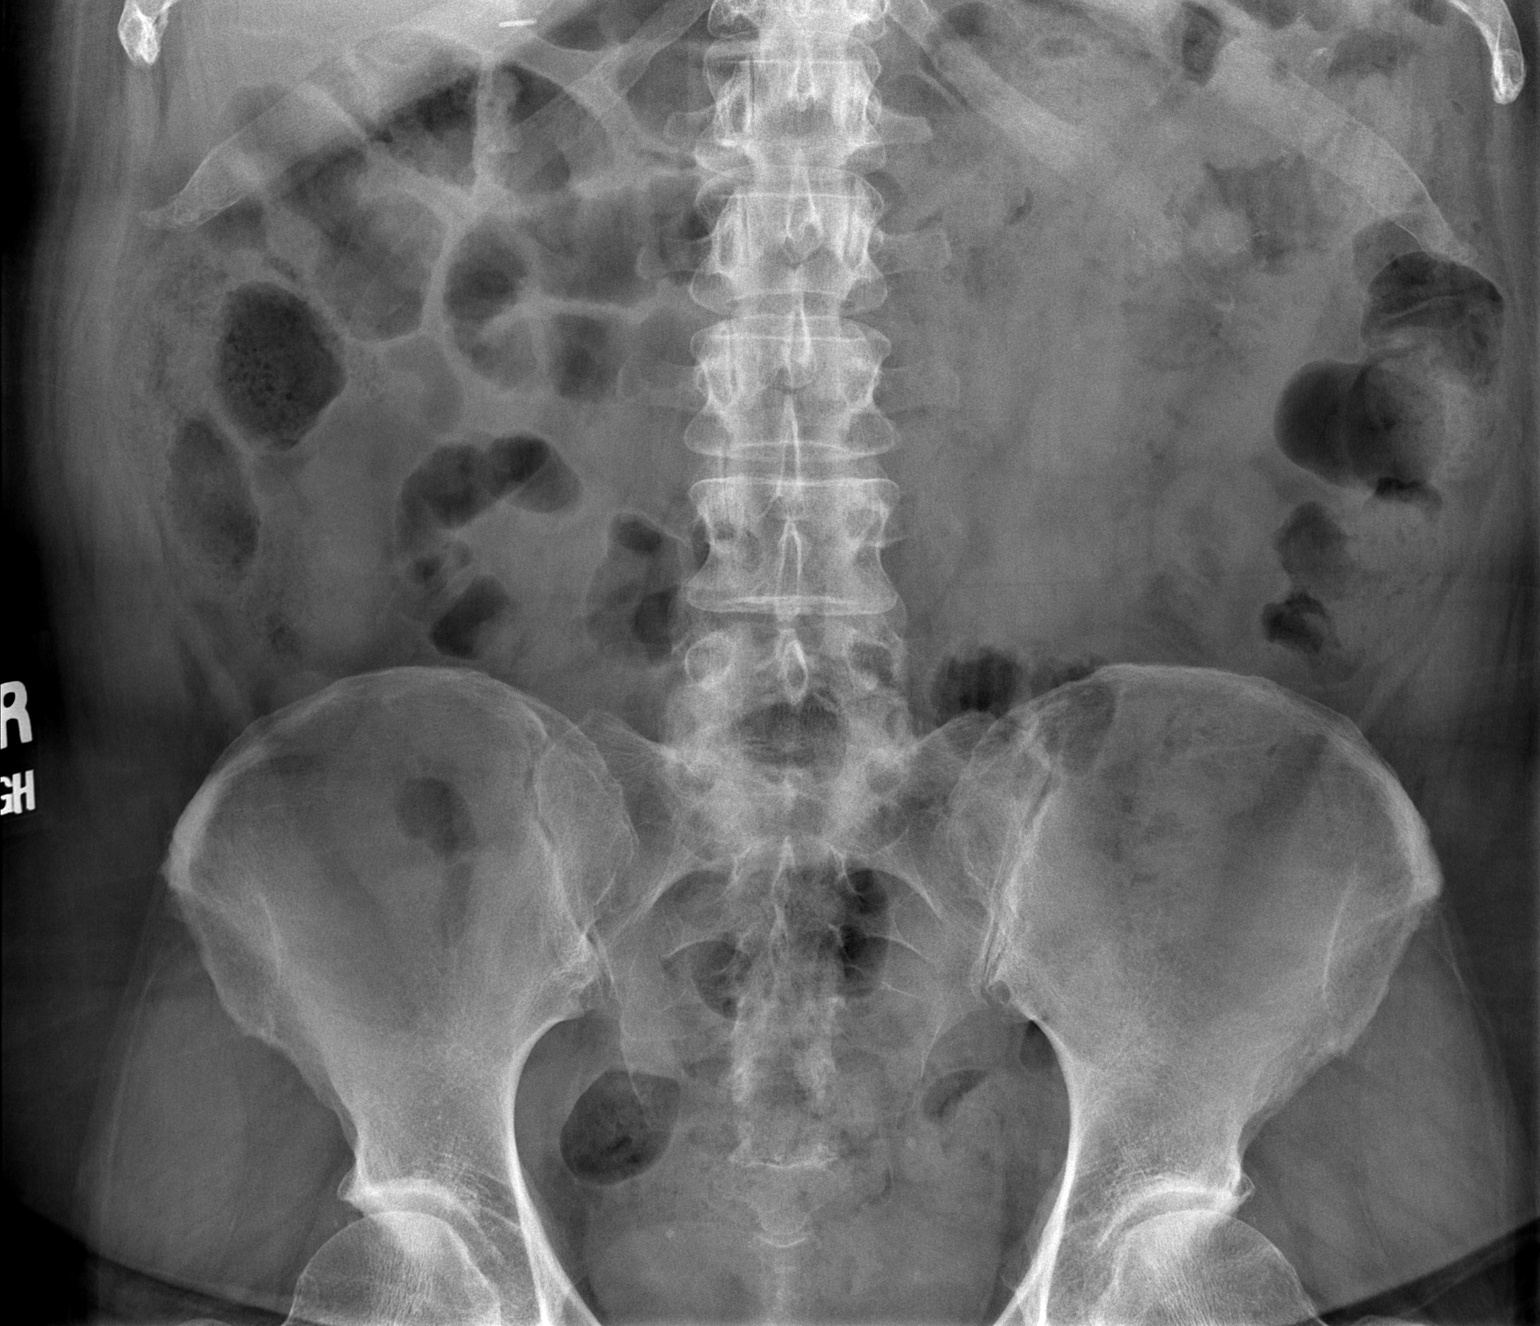

[2 of 2 positions shown; findings below may reference images not displayed]

FINDINGS: Nonobstructive bowel gas pattern. Moderate amount of formed stool
throughout the colon.

Postsurgical changes in the right upper quadrant of the abdomen.
IMPRESSION: 1. Nonobstructive bowel gas pattern.
2. Moderate amount of formed stool throughout the colon, usually
seen with constipation.

## 2022-10-07 DIAGNOSIS — H8110 Benign paroxysmal vertigo, unspecified ear: Secondary | ICD-10-CM | POA: Diagnosis not present

## 2022-11-15 DIAGNOSIS — J029 Acute pharyngitis, unspecified: Secondary | ICD-10-CM | POA: Diagnosis not present

## 2022-11-16 DIAGNOSIS — R102 Pelvic and perineal pain: Secondary | ICD-10-CM | POA: Diagnosis not present

## 2022-11-16 DIAGNOSIS — N939 Abnormal uterine and vaginal bleeding, unspecified: Secondary | ICD-10-CM | POA: Diagnosis not present

## 2022-11-25 ENCOUNTER — Other Ambulatory Visit: Payer: Self-pay

## 2022-11-25 DIAGNOSIS — N939 Abnormal uterine and vaginal bleeding, unspecified: Secondary | ICD-10-CM | POA: Diagnosis not present

## 2022-11-25 DIAGNOSIS — N950 Postmenopausal bleeding: Secondary | ICD-10-CM | POA: Diagnosis not present

## 2022-11-25 DIAGNOSIS — Z3202 Encounter for pregnancy test, result negative: Secondary | ICD-10-CM | POA: Diagnosis not present
